# Patient Record
Sex: Female | Born: 1995 | Race: Black or African American | Hispanic: No | Marital: Single | Smoking: Current some day smoker
Health system: Southern US, Community
[De-identification: ages and names within clinical notes are randomized; demographics above are authoritative.]

## PROBLEM LIST (undated history)

## (undated) DIAGNOSIS — N83209 Unspecified ovarian cyst, unspecified side: Secondary | ICD-10-CM

## (undated) HISTORY — PX: FRACTURE SURGERY: SHX138

---
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
## 2016-01-13 ENCOUNTER — Emergency Department (HOSPITAL_COMMUNITY)
Admission: EM | Admit: 2016-01-13 | Discharge: 2016-01-14 | Disposition: A | Discharge status: Home / Self Care | Payer: Medicaid Other | Attending: Emergency Medicine | Admitting: Emergency Medicine

## 2016-01-13 ENCOUNTER — Encounter (HOSPITAL_COMMUNITY): Payer: Self-pay | Admitting: Emergency Medicine

## 2016-01-13 DIAGNOSIS — R102 Pelvic and perineal pain: Secondary | ICD-10-CM

## 2016-01-13 DIAGNOSIS — R1030 Lower abdominal pain, unspecified: Secondary | ICD-10-CM | POA: Insufficient documentation

## 2016-01-13 DIAGNOSIS — Z79899 Other long term (current) drug therapy: Secondary | ICD-10-CM | POA: Insufficient documentation

## 2016-01-13 DIAGNOSIS — N898 Other specified noninflammatory disorders of vagina: Secondary | ICD-10-CM | POA: Insufficient documentation

## 2016-01-13 DIAGNOSIS — F17210 Nicotine dependence, cigarettes, uncomplicated: Secondary | ICD-10-CM | POA: Diagnosis not present

## 2016-01-13 DIAGNOSIS — M545 Low back pain: Secondary | ICD-10-CM | POA: Insufficient documentation

## 2016-01-13 DIAGNOSIS — R112 Nausea with vomiting, unspecified: Secondary | ICD-10-CM | POA: Diagnosis not present

## 2016-01-13 LAB — URINALYSIS, ROUTINE W REFLEX MICROSCOPIC
BILIRUBIN URINE: NEGATIVE
Glucose, UA: NEGATIVE mg/dL
Hgb urine dipstick: NEGATIVE
KETONES UR: NEGATIVE mg/dL
Leukocytes, UA: NEGATIVE
NITRITE: NEGATIVE
PROTEIN: NEGATIVE mg/dL
Specific Gravity, Urine: 1.02 (ref 1.005–1.030)
pH: 6.5 (ref 5.0–8.0)

## 2016-01-13 LAB — PREGNANCY, URINE: PREG TEST UR: NEGATIVE

## 2016-01-13 MED ORDER — ONDANSETRON HCL 4 MG/2ML IJ SOLN
4.0000 mg | Freq: Once | INTRAMUSCULAR | Status: AC
  Administered 2016-01-14: 4 mg via INTRAVENOUS
  Filled 2016-01-13: qty 2

## 2016-01-13 MED ORDER — KETOROLAC TROMETHAMINE 30 MG/ML IJ SOLN
30.0000 mg | Freq: Once | INTRAMUSCULAR | Status: AC
  Administered 2016-01-14: 30 mg via INTRAVENOUS
  Filled 2016-01-13: qty 1

## 2016-01-13 MED ORDER — SODIUM CHLORIDE 0.9 % IV SOLN
1000.0000 mL | Freq: Once | INTRAVENOUS | Status: AC
  Administered 2016-01-14: 1000 mL via INTRAVENOUS

## 2016-01-13 MED ORDER — SODIUM CHLORIDE 0.9 % IV SOLN
1000.0000 mL | INTRAVENOUS | Status: DC
  Administered 2016-01-14: 1000 mL via INTRAVENOUS

## 2016-01-13 NOTE — ED Provider Notes (Signed)
CSN: [id number]     Arrival date & time 01/13/16  1825 History  By signing my name below, I, Shannon Savage, attest that this documentation has been prepared under the direction and in the presence of Shannon Booze, MD. Electronically signed, Shannon Savage, ED Scribe. 01/14/2016. 1:16 AM.  Chief Complaint  Patient presents with  . Flank Pain    The history is provided by the patient. No language interpreter was used.   HPI Comments: Shannon Savage is a 20 y.o. female who is G1P0A1, with a PMHx of ovarian cyst, who presents to the Emergency Department complaining of gradually worsening, waxing and waning, 10/10 lower abdominal pain that radiates to her lower back. Pt reports abdominal pain is exacerbated by laying in different positions. Pt also reports associated back pain that is made worse by bending at the hips. Pt also reports nausea, vomiting and white vaginal discharge. Pt states her LNMP was last month. Pt denies any constipation, diarrhea, urinary frequency. Pt denies any pain while walking. Pt also states she does not have a PCP. Past Medical History  Diagnosis Date  . Ovarian cyst    Past Surgical History  Procedure Laterality Date  . Fracture surgery     History reviewed. No pertinent family history. Social History  Substance Use Topics  . Smoking status: Current Some Day Smoker -- 0.50 packs/day    Types: Cigarettes  . Smokeless tobacco: None  . Alcohol Use: Yes     Comment: occasionally   OB History    No data available     Review of Systems  Gastrointestinal: Positive for nausea, vomiting and abdominal pain (lower). Negative for diarrhea and constipation.  Genitourinary: Positive for vaginal discharge. Negative for frequency.  Musculoskeletal: Positive for back pain (lower).  All other systems reviewed and are negative.     Allergies  Review of patient's allergies indicates no known allergies.  Home Medications   Prior to Admission medications   Medication Sig  Start Date End Date Taking? Authorizing Provider  albuterol (PROVENTIL HFA;VENTOLIN HFA) 108 (90 Base) MCG/ACT inhaler Inhale 1-2 puffs into the lungs every 6 (six) hours as needed for wheezing or shortness of breath. 10/10/15  Yes Emily West, PA-C   BP 147/90 mmHg  Pulse 83  Temp(Src) 98.5 F (36.9 C) (Oral)  Resp 18  Ht  (1.702 m)  Wt 210 lb (95.255 kg)  BMI 32.88 kg/m2  SpO2 100%  LMP 12/21/2015 Physical Exam  Constitutional: She is oriented to person, place, and time. She appears well-developed and well-nourished. No distress.  HENT:  Head: Normocephalic and atraumatic.  Eyes: EOM are normal. Pupils are equal, round, and reactive to light.  Neck: Normal range of motion. Neck supple. No JVD present.  Cardiovascular: Normal rate, regular rhythm and normal heart sounds.   No murmur heard. Pulmonary/Chest: Effort normal and breath sounds normal. She has no wheezes. She has no rales. She exhibits no tenderness.  Abdominal: Soft. She exhibits no distension and no mass. There is tenderness. There is no rebound and no guarding.  Tender diffusely with maximum tenderness in suprapubic area No rebound or guiarding Bowel sounds slightly decresed   Musculoskeletal: Normal range of motion. She exhibits tenderness. She exhibits no edema.  Bilateral CVA tenderness left greater than right  Lymphadenopathy:    She has no cervical adenopathy.  Neurological: She is alert and oriented to person, place, and time. No cranial nerve deficit. She exhibits normal muscle tone. Coordination normal.  Skin: Skin is  warm and dry. No rash noted. She is not diaphoretic.  Psychiatric: She has a normal mood and affect. Her behavior is normal. Judgment and thought content normal.  Nursing note and vitals reviewed.   ED Course  Procedures  DIAGNOSTIC STUDIES: Oxygen Saturation is 100% on RA, normal by my interpretation.  COORDINATION OF CARE: 12:51 AM-Will order pain medication and blood work. Discussed  treatment plan with pt at bedside and pt agreed to plan. Labs Review Results for orders placed or performed during the hospital encounter of 01/13/16  Pregnancy, urine  Result Value Ref Range   Preg Test, Ur NEGATIVE NEGATIVE  Urinalysis, Routine w reflex microscopic (not at [facility])  Result Value Ref Range   Color, Urine YELLOW YELLOW   APPearance CLEAR CLEAR   Specific Gravity, Urine 1.020 1.005 - 1.030   pH 6.5 5.0 - 8.0   Glucose, UA NEGATIVE NEGATIVE mg/dL   Hgb urine dipstick NEGATIVE NEGATIVE   Bilirubin Urine NEGATIVE NEGATIVE   Ketones, ur NEGATIVE NEGATIVE mg/dL   Protein, ur NEGATIVE NEGATIVE mg/dL   Nitrite NEGATIVE NEGATIVE   Leukocytes, UA NEGATIVE NEGATIVE  CBC with Differential  Result Value Ref Range   WBC 3.7 (L) 4.0 - 10.5 K/uL   RBC 4.39 3.87 - 5.11 MIL/uL   Hemoglobin 12.3 12.0 - 15.0 g/dL   HCT 16.1 09.6 - 04.5 %   MCV 82.2 78.0 - 100.0 fL   MCH 28.0 26.0 - 34.0 pg   MCHC 34.1 30.0 - 36.0 g/dL   RDW 40.9 81.1 - 91.4 %   Platelets 197 150 - 400 K/uL   Neutrophils Relative % 20 %   Neutro Abs 0.7 (L) 1.7 - 7.7 K/uL   Lymphocytes Relative 69 %   Lymphs Abs 2.6 0.7 - 4.0 K/uL   Monocytes Relative 7 %   Monocytes Absolute 0.3 0.1 - 1.0 K/uL   Eosinophils Relative 1 %   Eosinophils Absolute 0.1 0.0 - 0.7 K/uL   Basophils Relative 3 %   Basophils Absolute 0.1 0.0 - 0.1 K/uL   WBC Morphology WHITE COUNT CONFIRMED ON SMEAR    RBC Morphology TEARDROP CELLS   Lipase, blood  Result Value Ref Range   Lipase 19 11 - 51 U/L  Comprehensive metabolic panel  Result Value Ref Range   Sodium 138 135 - 145 mmol/L   Potassium 3.6 3.5 - 5.1 mmol/L   Chloride 106 101 - 111 mmol/L   CO2 27 22 - 32 mmol/L   Glucose, Bld 86 65 - 99 mg/dL   BUN 9 6 - 20 mg/dL   Creatinine, Ser 7.82 0.44 - 1.00 mg/dL   Calcium 8.7 (L) 8.9 - 10.3 mg/dL   Total Protein 7.6 6.5 - 8.1 g/dL   Albumin 4.2 3.5 - 5.0 g/dL   AST 29 15 - 41 U/L   ALT 20 14 - 54 U/L   Alkaline Phosphatase 97 38  - 126 U/L   Total Bilirubin 0.4 0.3 - 1.2 mg/dL   GFR calc non Af Amer >60 >60 mL/min   GFR calc Af Amer >60 >60 mL/min   Anion gap 5 5 - 15   Imaging Review Ct Abdomen Pelvis W Contrast  01/14/2016  CLINICAL DATA:  Bilateral low back pain and right flank pain for 1 week. EXAM: CT ABDOMEN AND PELVIS WITH CONTRAST TECHNIQUE: Multidetector CT imaging of the abdomen and pelvis was performed using the standard protocol following bolus administration of intravenous contrast. CONTRAST:  ISOVUE-300 IOPAMIDOL (ISOVUE-300)  INJECTION 61% COMPARISON:  05/07/2015 FINDINGS: Mild dependent changes in the lung bases. Circumscribed isodense mass with central scar demonstrated in the liver, probably arising from the caudate lobe. Lesion measures 4.8 cm diameter. This probably represents focal nodular hyperplasia. No change since previous study. No other focal liver lesions identified. Moderately prominent lymph nodes in the porta hepatis measuring up to about 1.3 cm diameter. These are increasing since previous study. The gallbladder, spleen, pancreas, adrenal glands, kidneys, abdominal aorta, inferior vena cava, and retroperitoneal lymph nodes are unremarkable. Stomach, small bowel, and colon are not abnormally distended. No free air or free fluid in the abdomen. Pelvis: The appendix is normal. Bladder wall is not thickened. Uterus and ovaries are not enlarged. Involuting cyst in the right ovary. Small amount of free fluid in the pelvis is likely physiologic. Stool-filled rectum. No pelvic mass or lymphadenopathy. Prominent lymph nodes in the groin regions are likely reactive. No destructive bone lesions. 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 No change since prior study. Mild prominence of lymph nodes in the porta hepatis, increasing since previous study. Could consider follow-up with elective MRI to exclude hepatic adenoma.  Involuting cyst in the right ovary. Free fluid in the pelvis is likely physiologic. Electronically Signed   By: Burman NievesWilliam  Stevens M.D.   On: 01/14/2016 02:44   I have personally reviewed and evaluated these images and lab results as part of my medical decision-making.   MDM   Final diagnoses:  Pelvic pain in female    Pelvic pain which is likely related to ovarian cyst.Consider pelvic inflammatory disease, renal colic. Workup is initiated. Patient is advised that pelvic exam is necessary to adequately diagnose her condition. Patient is refusing pelvic exam. She does understand that some diagnosis cannot be made without pelvic exam. Ultrasound is not available on, so she is sent for CT which is unremarkable. Laboratory workup is also unremarkable including normal urinalysis. She was given a dose of morphine and ketorolac with only partial relief of pain. She's given a dose of hydromorphone with good relief of pain. She is discharged with prescription for oxycodone have acetaminophen and told to use over-the-counter NSAIDs. Follow-up with gynecology.  I personally performed the services described in this documentation, which was scribed in my presence. The recorded information has been reviewed and is accurate.     Shannon Boozeavid Valree Feild, MD 01/14/16 [phone]

## 2016-01-13 NOTE — ED Notes (Signed)
Pt states she has been having lower abd pain moving around into right flank for about a week.  Denies n/v/d.

## 2016-01-14 ENCOUNTER — Emergency Department (HOSPITAL_COMMUNITY): Payer: Medicaid Other

## 2016-01-14 LAB — CBC WITH DIFFERENTIAL/PLATELET
BASOS ABS: 0.1 10*3/uL (ref 0.0–0.1)
Basophils Relative: 3 %
EOS ABS: 0.1 10*3/uL (ref 0.0–0.7)
EOS PCT: 1 %
HCT: 36.1 % (ref 36.0–46.0)
Hemoglobin: 12.3 g/dL (ref 12.0–15.0)
LYMPHS ABS: 2.6 10*3/uL (ref 0.7–4.0)
LYMPHS PCT: 69 %
MCH: 28 pg (ref 26.0–34.0)
MCHC: 34.1 g/dL (ref 30.0–36.0)
MCV: 82.2 fL (ref 78.0–100.0)
Monocytes Absolute: 0.3 10*3/uL (ref 0.1–1.0)
Monocytes Relative: 7 %
NEUTROS PCT: 20 %
Neutro Abs: 0.7 10*3/uL — ABNORMAL LOW (ref 1.7–7.7)
Platelets: 197 10*3/uL (ref 150–400)
RBC: 4.39 MIL/uL (ref 3.87–5.11)
RDW: 12.5 % (ref 11.5–15.5)
WBC: 3.7 10*3/uL — AB (ref 4.0–10.5)

## 2016-01-14 LAB — COMPREHENSIVE METABOLIC PANEL
ALBUMIN: 4.2 g/dL (ref 3.5–5.0)
ALK PHOS: 97 U/L (ref 38–126)
ALT: 20 U/L (ref 14–54)
ANION GAP: 5 (ref 5–15)
AST: 29 U/L (ref 15–41)
BUN: 9 mg/dL (ref 6–20)
CALCIUM: 8.7 mg/dL — AB (ref 8.9–10.3)
CO2: 27 mmol/L (ref 22–32)
CREATININE: 0.68 mg/dL (ref 0.44–1.00)
Chloride: 106 mmol/L (ref 101–111)
Glucose, Bld: 86 mg/dL (ref 65–99)
Potassium: 3.6 mmol/L (ref 3.5–5.1)
SODIUM: 138 mmol/L (ref 135–145)
Total Bilirubin: 0.4 mg/dL (ref 0.3–1.2)
Total Protein: 7.6 g/dL (ref 6.5–8.1)

## 2016-01-14 LAB — LIPASE, BLOOD: LIPASE: 19 U/L (ref 11–51)

## 2016-01-14 MED ORDER — IOPAMIDOL (ISOVUE-300) INJECTION 61%
100.0000 mL | Freq: Once | INTRAVENOUS | Status: AC | PRN
  Administered 2016-01-14: 100 mL via INTRAVENOUS

## 2016-01-14 MED ORDER — HYDROMORPHONE HCL 1 MG/ML IJ SOLN
1.0000 mg | Freq: Once | INTRAMUSCULAR | Status: AC
  Administered 2016-01-14: 1 mg via INTRAVENOUS
  Filled 2016-01-14: qty 1

## 2016-01-14 MED ORDER — MORPHINE SULFATE (PF) 4 MG/ML IV SOLN
4.0000 mg | Freq: Once | INTRAVENOUS | Status: AC
  Administered 2016-01-14: 4 mg via INTRAVENOUS
  Filled 2016-01-14: qty 1

## 2016-01-14 MED ORDER — OXYCODONE-ACETAMINOPHEN 5-325 MG PO TABS: 1.0000 | ORAL_TABLET | ORAL | Status: DC | PRN

## 2016-01-14 NOTE — Discharge Instructions (Signed)
Take ibuprofen as needed for less severe pain.  Pelvic Pain, Female Female pelvic pain can be caused by many different things and start from a variety of places. Pelvic pain refers to pain that is located in the lower half of the abdomen and between your hips. The pain may occur over a short period of time (acute) or may be reoccurring (chronic). The cause of pelvic pain may be related to disorders affecting the female reproductive organs (gynecologic), but it may also be related to the bladder, kidney stones, an intestinal complication, or muscle or skeletal problems. Getting help right away for pelvic pain is important, especially if there has been severe, sharp, or a sudden onset of unusual pain. It is also important to get help right away because some types of pelvic pain can be life threatening.  CAUSES  Below are only some of the causes of pelvic pain. The causes of pelvic pain can be in one of several categories.   Gynecologic.  Pelvic inflammatory disease.  Sexually transmitted infection.  Ovarian cyst or a twisted ovarian ligament (ovarian torsion).  Uterine lining that grows outside the uterus (endometriosis).  Fibroids, cysts, or tumors.  Ovulation.  Pregnancy.  Pregnancy that occurs outside the uterus (ectopic pregnancy).  Miscarriage.  Labor.  Abruption of the placenta or ruptured uterus.  Infection.  Uterine infection (endometritis).  Bladder infection.  Diverticulitis.  Miscarriage related to a uterine infection (septic abortion).  Bladder.  Inflammation of the bladder (cystitis).  Kidney stone(s).  Gastrointestinal.  Constipation.  Diverticulitis.  Neurologic.  Trauma.  Feeling pelvic pain because of mental or emotional causes (psychosomatic).  Cancers of the bowel or pelvis. EVALUATION  Your caregiver will want to take a careful history of your concerns. This includes recent changes in your health, a careful gynecologic history of your  periods (menses), and a sexual history. Obtaining your family history and medical history is also important. Your caregiver may suggest a pelvic exam. A pelvic exam will help identify the location and severity of the pain. It also helps in the evaluation of which organ system may be involved. In order to identify the cause of the pelvic pain and be properly treated, your caregiver may order tests. These tests may include:   A pregnancy test.  Pelvic ultrasonography.  An X-ray exam of the abdomen.  A urinalysis or evaluation of vaginal discharge.  Blood tests. HOME CARE INSTRUCTIONS   Only take over-the-counter or prescription medicines for pain, discomfort, or fever as directed by your caregiver.   Rest as directed by your caregiver.   Eat a balanced diet.   Drink enough fluids to make your urine clear or pale yellow, or as directed.   Avoid sexual intercourse if it causes pain.   Apply warm or cold compresses to the lower abdomen depending on which one helps the pain.   Avoid stressful situations.   Keep a journal of your pelvic pain. Write down when it started, where the pain is located, and if there are things that seem to be associated with the pain, such as food or your menstrual cycle.  Follow up with your caregiver as directed.  SEEK MEDICAL CARE IF:  Your medicine does not help your pain.  You have abnormal vaginal discharge. SEEK IMMEDIATE MEDICAL CARE IF:   You have heavy bleeding from the vagina.   Your pelvic pain increases.   You feel light-headed or faint.   You have chills.   You have pain with urination or  blood in your urine.   You have uncontrolled diarrhea or vomiting.   You have a fever or persistent symptoms for more than 3 days.  You have a fever and your symptoms suddenly get worse.   You are being physically or sexually abused.   This information is not intended to replace advice given to you by your health care provider.  Make sure you discuss any questions you have with your health care provider.   Document Released: 06/16/2004 Document Revised: 04/10/2015 Document Reviewed: 11/09/2011 Elsevier Interactive Patient Education 2016 [employer]vier Inc.  Ovarian Cyst An ovarian cyst is a fluid-filled sac that forms on an ovary. The ovaries are small organs that produce eggs in women. Various types of cysts can form on the ovaries. Most are not cancerous. Many do not cause problems, and they often go away on their own. Some may cause symptoms and require treatment. Common types of ovarian cysts include:  Functional cysts--These cysts may occur every month during the menstrual cycle. This is normal. The cysts usually go away with the next menstrual cycle if the woman does not get pregnant. Usually, there are no symptoms with a functional cyst.  Endometrioma cysts--These cysts form from the tissue that lines the uterus. They are also called "chocolate cysts" because they become filled with blood that turns brown. This type of cyst can cause pain in the lower abdomen during intercourse and with your menstrual period.  Cystadenoma cysts--This type develops from the cells on the outside of the ovary. These cysts can get very big and cause lower abdomen pain and pain with intercourse. This type of cyst can twist on itself, cut off its blood supply, and cause severe pain. It can also easily rupture and cause a lot of pain.  Dermoid cysts--This type of cyst is sometimes found in both ovaries. These cysts may contain different kinds of body tissue, such as skin, teeth, hair, or cartilage. They usually do not cause symptoms unless they get very big.  Theca lutein cysts--These cysts occur when too much of a certain hormone (human chorionic gonadotropin) is produced and overstimulates the ovaries to produce an egg. This is most common after procedures used to assist with the conception of a baby (in vitro fertilization). CAUSES    Fertility drugs can cause a condition in which multiple large cysts are formed on the ovaries. This is called ovarian hyperstimulation syndrome.  A condition called polycystic ovary syndrome can cause hormonal imbalances that can lead to nonfunctional ovarian cysts. SIGNS AND SYMPTOMS  Many ovarian cysts do not cause symptoms. If symptoms are present, they may include:  Pelvic pain or pressure.  Pain in the lower abdomen.  Pain during sexual intercourse.  Increasing girth (swelling) of the abdomen.  Abnormal menstrual periods.  Increasing pain with menstrual periods.  Stopping having menstrual periods without being pregnant. DIAGNOSIS  These cysts are commonly found during a routine or annual pelvic exam. Tests may be ordered to find out more about the cyst. These tests may include:  Ultrasound.  X-ray of the pelvis.  CT scan.  MRI.  Blood tests. TREATMENT  Many ovarian cysts go away on their own without treatment. Your health care provider may want to check your cyst regularly for 2-3 months to see if it changes. For women in menopause, it is particularly important to monitor a cyst closely because of the higher rate of ovarian cancer in menopausal women. When treatment is needed, it may include any of the following:  A procedure to drain the cyst (aspiration). This may be done using a long needle and ultrasound. It can also be done through a laparoscopic procedure. This involves using a thin, lighted tube with a tiny camera on the end (laparoscope) inserted through a small incision.  Surgery to remove the whole cyst. This may be done using laparoscopic surgery or an open surgery involving a larger incision in the lower abdomen.  Hormone treatment or birth control pills. These methods are sometimes used to help dissolve a cyst. HOME CARE INSTRUCTIONS   Only take over-the-counter or prescription medicines as directed by your health care provider.  Follow up with your  health care provider as directed.  Get regular pelvic exams and Pap tests. SEEK MEDICAL CARE IF:   Your periods are late, irregular, or painful, or they stop.  Your pelvic pain or abdominal pain does not go away.  Your abdomen becomes larger or swollen.  You have pressure on your bladder or trouble emptying your bladder completely.  You have pain during sexual intercourse.  You have feelings of fullness, pressure, or discomfort in your stomach.  You lose weight for no apparent reason.  You feel generally ill.  You become constipated.  You lose your appetite.  You develop acne.  You have an increase in body and facial hair.  You are gaining weight, without changing your exercise and eating habits.  You think you are pregnant. SEEK IMMEDIATE MEDICAL CARE IF:   You have increasing abdominal pain.  You feel sick to your stomach (nauseous), and you throw up (vomit).  You develop a fever that comes on suddenly.  You have abdominal pain during a bowel movement.  Your menstrual periods become heavier than usual. MAKE SURE YOU:  Understand these instructions.  Will watch your condition.  Will get help right away if you are not doing well or get worse.   This information is not intended to replace advice given to you by your health care provider. Make sure you discuss any questions you have with your health care provider.   Document Released: 07/20/2005 Document Revised: 07/25/2013 Document Reviewed: 03/27/2013 Elsevier Interactive Patient Education 2016 Elsevier Inc.  Acetaminophen; Oxycodone tablets What is this medicine? ACETAMINOPHEN; OXYCODONE (a set a MEE noe fen; ox i KOE done) is a pain reliever. It is used to treat moderate to severe pain. This medicine may be used for other purposes; ask your health care provider or pharmacist if you have questions. What should I tell my health care provider before I take this medicine? They need to know if you have any  of these conditions: -brain tumor -Crohn's disease, inflammatory bowel disease, or ulcerative colitis -drug abuse or addiction -head injury -heart or circulation problems -if you often drink alcohol -kidney disease or problems going to the bathroom -liver disease -lung disease, asthma, or breathing problems -an unusual or allergic reaction to acetaminophen, oxycodone, other opioid analgesics, other medicines, foods, dyes, or preservatives -pregnant or trying to get pregnant -breast-feeding How should I use this medicine? Take this medicine by mouth with a full glass of water. Follow the directions on the prescription label. You can take it with or without food. If it upsets your stomach, take it with food. Take your medicine at regular intervals. Do not take it more often than directed. Talk to your pediatrician regarding the use of this medicine in children. Special care may be needed. Patients over 19 years old may have a stronger reaction and need a  smaller dose. Overdosage: If you think you have taken too much of this medicine contact a poison control center or emergency room at once. NOTE: This medicine is only for you. Do not share this medicine with others. What if I miss a dose? If you miss a dose, take it as soon as you can. If it is almost time for your next dose, take only that dose. Do not take double or extra doses. What may interact with this medicine? -alcohol -antihistamines -barbiturates like amobarbital, butalbital, butabarbital, methohexital, pentobarbital, phenobarbital, thiopental, and secobarbital -benztropine -drugs for bladder problems like solifenacin, trospium, oxybutynin, tolterodine, hyoscyamine, and methscopolamine -drugs for breathing problems like ipratropium and tiotropium -drugs for certain stomach or intestine problems like propantheline, homatropine methylbromide, glycopyrrolate, atropine, belladonna, and dicyclomine -general anesthetics like etomidate,  ketamine, nitrous oxide, propofol, desflurane, enflurane, halothane, isoflurane, and sevoflurane -medicines for depression, anxiety, or psychotic disturbances -medicines for sleep -muscle relaxants -naltrexone -narcotic medicines (opiates) for pain -phenothiazines like perphenazine, thioridazine, chlorpromazine, mesoridazine, fluphenazine, prochlorperazine, promazine, and trifluoperazine -scopolamine -tramadol -trihexyphenidyl This list may not describe all possible interactions. Give your health care provider a list of all the medicines, herbs, non-prescription drugs, or dietary supplements you use. Also tell them if you smoke, drink alcohol, or use illegal drugs. Some items may interact with your medicine. What should I watch for while using this medicine? Tell your doctor or health care professional if your pain does not go away, if it gets worse, or if you have new or a different type of pain. You may develop tolerance to the medicine. Tolerance means that you will need a higher dose of the medication for pain relief. Tolerance is normal and is expected if you take this medicine for a long time. Do not suddenly stop taking your medicine because you may develop a severe reaction. Your body becomes used to the medicine. This does NOT mean you are addicted. Addiction is a behavior related to getting and using a drug for a non-medical reason. If you have pain, you have a medical reason to take pain medicine. Your doctor will tell you how much medicine to take. If your doctor wants you to stop the medicine, the dose will be slowly lowered over time to avoid any side effects. You may get drowsy or dizzy. Do not drive, use machinery, or do anything that needs mental alertness until you know how this medicine affects you. Do not stand or sit up quickly, especially if you are an older patient. This reduces the risk of dizzy or fainting spells. Alcohol may interfere with the effect of this medicine. Avoid  alcoholic drinks. There are different types of narcotic medicines (opiates) for pain. If you take more than one type at the same time, you may have more side effects. Give your health care provider a list of all medicines you use. Your doctor will tell you how much medicine to take. Do not take more medicine than directed. Call emergency for help if you have problems breathing. The medicine will cause constipation. Try to have a bowel movement at least every 2 to 3 days. If you do not have a bowel movement for 3 days, call your doctor or health care professional. Do not take Tylenol (acetaminophen) or medicines that have acetaminophen with this medicine. Too much acetaminophen can be very dangerous. Many nonprescription medicines contain acetaminophen. Always read the labels carefully to avoid taking more acetaminophen. What side effects may I notice from receiving this medicine? Side effects that you  should report to your doctor or health care professional as soon as possible: -allergic reactions like skin rash, itching or hives, swelling of the face, lips, or tongue -breathing difficulties, wheezing -confusion -light headedness or fainting spells -severe stomach pain -unusually weak or tired -yellowing of the skin or the whites of the eyes Side effects that usually do not require medical attention (report to your doctor or health care professional if they continue or are bothersome): -dizziness -drowsiness -nausea -vomiting This list may not describe all possible side effects. Call your doctor for medical advice about side effects. You may report side effects to FDA at 1-800-FDA-1088. Where should I keep my medicine? Keep out of the reach of children. This medicine can be abused. Keep your medicine in a safe place to protect it from theft. Do not share this medicine with anyone. Selling or giving away this medicine is dangerous and against the law. This medicine may cause accidental overdose  and death if it taken by other adults, children, or pets. Mix any unused medicine with a substance like cat litter or coffee grounds. Then throw the medicine away in a sealed container like a sealed bag or a coffee can with a lid. Do not use the medicine after the expiration date. Store at room temperature between 20 and 25 degrees C (68 and 77 degrees F). NOTE: This sheet is a summary. It may not cover all possible information. If you have questions about this medicine, talk to your doctor, pharmacist, or health care provider.    2016, Elsevier/Gold Standard. (2014-06-20 15:18:46)

## 2016-01-14 NOTE — ED Notes (Signed)
Pt and significant other remain lying on stretcher, instructed pt to leave arm straight so the IV fluids could flow freely.

## 2016-01-14 NOTE — ED Notes (Signed)
Pt states, she "does not want a pelvic performed". MD made aware.

## 2016-01-15 LAB — HIV ANTIBODY (ROUTINE TESTING W REFLEX): HIV Screen 4th Generation wRfx: NONREACTIVE

## 2016-01-15 LAB — RPR: RPR Ser Ql: NONREACTIVE

## 2016-01-24 ENCOUNTER — Encounter (HOSPITAL_COMMUNITY): Payer: Self-pay | Admitting: Emergency Medicine

## 2016-01-24 ENCOUNTER — Emergency Department (HOSPITAL_COMMUNITY)
Admission: EM | Admit: 2016-01-24 | Discharge: 2016-01-24 | Discharge status: Against Medical Advice | Payer: Medicaid Other | Attending: Emergency Medicine | Admitting: Emergency Medicine

## 2016-01-24 DIAGNOSIS — R079 Chest pain, unspecified: Secondary | ICD-10-CM | POA: Insufficient documentation

## 2016-01-24 DIAGNOSIS — M545 Low back pain: Secondary | ICD-10-CM | POA: Insufficient documentation

## 2016-01-24 DIAGNOSIS — R0981 Nasal congestion: Secondary | ICD-10-CM | POA: Diagnosis not present

## 2016-01-24 DIAGNOSIS — R05 Cough: Secondary | ICD-10-CM | POA: Insufficient documentation

## 2016-01-24 DIAGNOSIS — Z79899 Other long term (current) drug therapy: Secondary | ICD-10-CM | POA: Insufficient documentation

## 2016-01-24 DIAGNOSIS — F17210 Nicotine dependence, cigarettes, uncomplicated: Secondary | ICD-10-CM | POA: Insufficient documentation

## 2016-01-24 DIAGNOSIS — R110 Nausea: Secondary | ICD-10-CM | POA: Diagnosis not present

## 2016-01-24 DIAGNOSIS — R102 Pelvic and perineal pain: Secondary | ICD-10-CM | POA: Diagnosis not present

## 2016-01-24 DIAGNOSIS — R1031 Right lower quadrant pain: Secondary | ICD-10-CM | POA: Diagnosis present

## 2016-01-24 LAB — COMPREHENSIVE METABOLIC PANEL
ALK PHOS: 97 U/L (ref 38–126)
ALT: 39 U/L (ref 14–54)
AST: 39 U/L (ref 15–41)
Albumin: 4.1 g/dL (ref 3.5–5.0)
Anion gap: 6 (ref 5–15)
BUN: 10 mg/dL (ref 6–20)
CALCIUM: 8.6 mg/dL — AB (ref 8.9–10.3)
CHLORIDE: 106 mmol/L (ref 101–111)
CO2: 26 mmol/L (ref 22–32)
CREATININE: 0.61 mg/dL (ref 0.44–1.00)
GFR calc non Af Amer: >60 mL/min (ref >60)
Glucose, Bld: 84 mg/dL (ref 65–99)
Potassium: 3.4 mmol/L — ABNORMAL LOW (ref 3.5–5.1)
SODIUM: 138 mmol/L (ref 135–145)
Total Bilirubin: 0.7 mg/dL (ref 0.3–1.2)
Total Protein: 7.5 g/dL (ref 6.5–8.1)

## 2016-01-24 LAB — CBC WITH DIFFERENTIAL/PLATELET
BASOS ABS: 0.1 10*3/uL (ref 0.0–0.1)
Basophils Relative: 2 %
Eosinophils Absolute: 0.1 10*3/uL (ref 0.0–0.7)
Eosinophils Relative: 2 %
HCT: 37.3 % (ref 36.0–46.0)
HEMOGLOBIN: 12.7 g/dL (ref 12.0–15.0)
LYMPHS ABS: 3.1 10*3/uL (ref 0.7–4.0)
LYMPHS PCT: 63 %
MCH: 27.7 pg (ref 26.0–34.0)
MCHC: 34 g/dL (ref 30.0–36.0)
MCV: 81.4 fL (ref 78.0–100.0)
Monocytes Absolute: 0.7 10*3/uL (ref 0.1–1.0)
Monocytes Relative: 13 %
NEUTROS PCT: 20 %
Neutro Abs: 1 10*3/uL — ABNORMAL LOW (ref 1.7–7.7)
PLATELETS: 226 10*3/uL (ref 150–400)
RBC: 4.58 MIL/uL (ref 3.87–5.11)
RDW: 12.6 % (ref 11.5–15.5)
WBC: 5 10*3/uL (ref 4.0–10.5)

## 2016-01-24 LAB — URINALYSIS, ROUTINE W REFLEX MICROSCOPIC
GLUCOSE, UA: NEGATIVE mg/dL
Ketones, ur: 15 mg/dL — AB
LEUKOCYTES UA: NEGATIVE
Nitrite: NEGATIVE
PROTEIN: 100 mg/dL — AB
pH: 6 (ref 5.0–8.0)

## 2016-01-24 LAB — URINE MICROSCOPIC-ADD ON

## 2016-01-24 LAB — I-STAT BETA HCG BLOOD, ED (MC, WL, AP ONLY): I-stat hCG, quantitative: <5 m[IU]/mL (ref <5)

## 2016-01-24 LAB — LIPASE, BLOOD: Lipase: 18 U/L (ref 11–51)

## 2016-01-24 MED ORDER — FENTANYL CITRATE (PF) 100 MCG/2ML IJ SOLN: 50.0000 ug | Freq: Once | INTRAMUSCULAR | Status: DC

## 2016-01-24 MED ORDER — SODIUM CHLORIDE 0.9 % IV SOLN: INTRAVENOUS | Status: DC

## 2016-01-24 MED ORDER — FENTANYL CITRATE (PF) 100 MCG/2ML IJ SOLN
50.0000 ug | Freq: Once | INTRAMUSCULAR | Status: AC
  Administered 2016-01-24: 50 ug via INTRAVENOUS
  Filled 2016-01-24: qty 2

## 2016-01-24 MED ORDER — SODIUM CHLORIDE 0.9 % IV BOLUS (SEPSIS)
1000.0000 mL | Freq: Once | INTRAVENOUS | Status: AC
  Administered 2016-01-24: 1000 mL via INTRAVENOUS

## 2016-01-24 MED ORDER — ONDANSETRON HCL 4 MG/2ML IJ SOLN
4.0000 mg | Freq: Once | INTRAMUSCULAR | Status: AC
  Administered 2016-01-24: 4 mg via INTRAVENOUS
  Filled 2016-01-24: qty 2

## 2016-01-24 NOTE — ED Notes (Signed)
C/o abdominal pain, rating pain 10/10.  Says whole body hurts.

## 2016-01-24 NOTE — ED Provider Notes (Signed)
CSN: [id number]     Arrival date & time 01/24/16  1759 History  By signing my name below, I, Ronney LionSuzanne Le, attest that this documentation has been prepared under the direction and in the presence of Vanetta MuldersScott Reanne Nellums, MD. Electronically Signed: Ronney LionSuzanne Le, ED Scribe. 01/24/2016. 10:46 PM.  Chief Complaint  Patient presents with  . Abdominal Pain   Patient is a 20 y.o. female presenting with abdominal pain. The history is provided by the patient. No language interpreter was used.  Abdominal Pain Pain location:  RLQ and LLQ Pain quality: burning, sharp and shooting   Pain radiates to:  Back Pain severity:  Severe Onset quality:  Gradual Duration:  11 days Timing:  Constant Progression:  Worsening Chronicity:  New Relieved by: oxycodone with acetaminophen. Worsened by:  Nothing tried Ineffective treatments:  None tried Associated symptoms: chest pain (left-sided), cough and nausea   Associated symptoms: no chills, no diarrhea, no dysuria, no fever, no shortness of breath, no sore throat and no vomiting    HPI Comments: Shannon Savage is a 20 y.o. female with a history of ovarian cyst, who presents to the Emergency Department complaining of constant, 10/10, burning, sharp, shooting lower abdominal pain radiating to her lower back, right side greater than left, that began 11 days ago and acutely worsened about 8 days ago. She also complains of associated generalized myalgias, left-sided chest pain that began 2 days ago, nausea, nasal congestion, and cough. Per chart review, patient was seen by Dr. Preston FleetingGlick on 01/14/16 for the same lower abdominal pain radiating to her lower back. However, she refused a pelvic exam at that time, per medical records. Patient today also refuses a pelvic exam, stating, "I'm on my period, and I wouldn't feel right. That would be nasty." Records show that lab results and a CT abdomen at that visit was unremarkable. She was discharged with oxycodone with acetaminophen and  instructed to use NSAIDs, and follow-up with gynecology. She states she had taken the medications with moderate but transient relief. Patient denies diarrhea, fevers, chills, visual changes, rhinorrhea, sore throat, SOB, vomiting, diarrhea, dysuria, leg swelling, bleeding easily, rash, or headache.   Past Medical History  Diagnosis Date  . Ovarian cyst    Past Surgical History  Procedure Laterality Date  . Fracture surgery     History reviewed. No pertinent family history. Social History  Substance Use Topics  . Smoking status: Current Some Day Smoker -- 0.50 packs/day    Types: Cigarettes  . Smokeless tobacco: None  . Alcohol Use: Yes     Comment: occasionally   OB History    No data available     Review of Systems  Constitutional: Negative for fever and chills.  HENT: Positive for congestion. Negative for rhinorrhea and sore throat.   Eyes: Negative for visual disturbance.  Respiratory: Positive for cough. Negative for shortness of breath.   Cardiovascular: Positive for chest pain (left-sided). Negative for leg swelling.  Gastrointestinal: Positive for nausea and abdominal pain. Negative for vomiting and diarrhea.  Genitourinary: Negative for dysuria.  Musculoskeletal: Positive for myalgias (generalized) and back pain.  Skin: Negative for rash.  Neurological: Negative for headaches.  Hematological: Does not bruise/bleed easily.     Allergies  Review of patient's allergies indicates no known allergies.  Home Medications   Prior to Admission medications   Medication Sig Start Date End Date Taking? Authorizing Provider  albuterol (PROVENTIL HFA;VENTOLIN HFA) 108 (90 Base) MCG/ACT inhaler Inhale 1-2 puffs into the lungs every  6 (six) hours as needed for wheezing or shortness of breath. 10/10/15  Yes Trixie Dredge, PA-C  oxyCODONE-acetaminophen (PERCOCET) 5-325 MG tablet Take 1 tablet by mouth every 4 (four) hours as needed for moderate pain. Patient not taking: Reported on  01/24/2016 01/14/16   [name], MD   BP 119/78 mmHg  Pulse 71  Temp(Src) 98.7 F (37.1 C)  Resp 18  SpO2 100%  LMP 01/23/2016 Physical Exam  Constitutional: She is oriented to person, place, and time. She appears well-developed and well-nourished. No distress.  HENT:  Head: Normocephalic and atraumatic.  Mouth/Throat: Oropharynx is clear and moist.  Mucous membranes are moist.   Eyes: Conjunctivae and EOM are normal. Pupils are equal, round, and reactive to light. No scleral icterus.  Pupils are normal. Scleras are clear. Eyes track normal.    Neck: Neck supple. No tracheal deviation present.  Cardiovascular: Normal rate, regular rhythm and normal heart sounds.   No murmur heard. Pulmonary/Chest: Effort normal and breath sounds normal. No respiratory distress. She has no wheezes. She has no rales.  Lungs are clear to auscultation bilaterally. Room air sats at 100%.  Abdominal: Soft. Bowel sounds are normal. There is tenderness. There is no guarding.  Bilateral lower abdominal tenderness, no guarding.  Musculoskeletal: Normal range of motion. She exhibits no edema.  No ankle swelling.  Neurological: She is alert and oriented to person, place, and time.  Skin: Skin is warm and dry.  Psychiatric: She has a normal mood and affect. Her behavior is normal.  Nursing note and vitals reviewed.   ED Course  Procedures (including critical care time)  DIAGNOSTIC STUDIES: Oxygen Saturation is 100% on RA, normal by my interpretation.    COORDINATION OF CARE: 8:55 PM - Discussed treatment plan with pt at bedside which includes IV fluids, nausea medications. Strongly encouraged pt to consider pelvic exam to r/o PID. Pt verbalized understanding and agreed to plan.   Labs Review Labs Reviewed  CBC WITH DIFFERENTIAL/PLATELET - Abnormal; Notable for the following:    Neutro Abs 1.0 (*)    All other components within normal limits  URINALYSIS, ROUTINE W REFLEX MICROSCOPIC (NOT AT [facility]) -  Abnormal; Notable for the following:    Color, Urine AMBER (*)    Specific Gravity, Urine >1.030 (*)    Hgb urine dipstick LARGE (*)    Bilirubin Urine SMALL (*)    Ketones, ur 15 (*)    Protein, ur 100 (*)    All other components within normal limits  COMPREHENSIVE METABOLIC PANEL - Abnormal; Notable for the following:    Potassium 3.4 (*)    Calcium 8.6 (*)    All other components within normal limits  URINE MICROSCOPIC-ADD ON - Abnormal; Notable for the following:    Squamous Epithelial / LPF 6-30 (*)    Bacteria, UA RARE (*)    All other components within normal limits  WET PREP, GENITAL  LIPASE, BLOOD  HIV ANTIBODY (ROUTINE TESTING)  I-STAT BETA HCG BLOOD, ED ([facility], [facility], [facility] ONLY)  GC/CHLAMYDIA PROBE AMP ([place]) NOT AT [facility]    Imaging Review No results found. I have personally reviewed and evaluated these images and lab results as part of my medical decision-making.   EKG Interpretation None      MDM   Final diagnoses:  Pelvic pain in female   Patient left AMA without notice. Patient had finally agreed to have the pelvic exam done. We were prepping for that fixing the new patient left  without telling anybody. Patient now with persistent pelvic pain since June 12. Concern was for PID. Patient's pregnancy test is negative labs here today without any significant abnormalities. No leukocytosis. Urinalysis negative for urinary tract infection.  I personally performed the services described in this documentation, which was scribed in my presence. The recorded information has been reviewed and is accurate.       Vanetta MuldersScott Aboubacar Matsuo, MD 01/24/16 2317

## 2016-01-24 NOTE — ED Notes (Signed)
Pt came out to nursing desk stating " i am not staying, yall need to fix this shit" pt upset because there was blood in her iv cath, Rn attempted to explain to pt that blood does back up sometimes and all was needed was a flush, pt refused to allow RN to flush iv, states ": i want this shit out now" , risks of leaving ama explained to pt, pt states " I am going elsewhere, take this thing out" iv removed, cath intact, pt refused to sign ama form, pt loud as she was leaving by nursing desk states " i aint staying" witnessed by [place], RCharity fundraiser

## 2016-01-26 LAB — HIV ANTIBODY (ROUTINE TESTING W REFLEX): HIV Screen 4th Generation wRfx: NONREACTIVE

## 2016-03-02 ENCOUNTER — Emergency Department (HOSPITAL_COMMUNITY)
Admission: EM | Admit: 2016-03-02 | Discharge: 2016-03-02 | Disposition: A | Discharge status: Home / Self Care | Payer: Medicaid Other | Attending: Emergency Medicine | Admitting: Emergency Medicine

## 2016-03-02 ENCOUNTER — Encounter (HOSPITAL_COMMUNITY): Payer: Self-pay | Admitting: *Deleted

## 2016-03-02 DIAGNOSIS — M25362 Other instability, left knee: Secondary | ICD-10-CM

## 2016-03-02 DIAGNOSIS — R1031 Right lower quadrant pain: Secondary | ICD-10-CM | POA: Diagnosis not present

## 2016-03-02 DIAGNOSIS — R51 Headache: Secondary | ICD-10-CM | POA: Diagnosis not present

## 2016-03-02 DIAGNOSIS — F17210 Nicotine dependence, cigarettes, uncomplicated: Secondary | ICD-10-CM | POA: Insufficient documentation

## 2016-03-02 DIAGNOSIS — R519 Headache, unspecified: Secondary | ICD-10-CM

## 2016-03-02 LAB — URINALYSIS, ROUTINE W REFLEX MICROSCOPIC
Bilirubin Urine: NEGATIVE
GLUCOSE, UA: NEGATIVE mg/dL
HGB URINE DIPSTICK: NEGATIVE
Ketones, ur: NEGATIVE mg/dL
LEUKOCYTES UA: NEGATIVE
Nitrite: NEGATIVE
PH: 5.5 (ref 5.0–8.0)
Protein, ur: NEGATIVE mg/dL
Specific Gravity, Urine: 1.02 (ref 1.005–1.030)

## 2016-03-02 LAB — POC URINE PREG, ED: Preg Test, Ur: NEGATIVE

## 2016-03-02 MED ORDER — DIPHENHYDRAMINE HCL 50 MG/ML IJ SOLN
25.0000 mg | Freq: Once | INTRAMUSCULAR | Status: AC
  Administered 2016-03-02: 25 mg via INTRAVENOUS
  Filled 2016-03-02: qty 1

## 2016-03-02 MED ORDER — NAPROXEN 500 MG PO TABS: ORAL_TABLET | ORAL | 0 refills | Status: DC

## 2016-03-02 MED ORDER — KETOROLAC TROMETHAMINE 30 MG/ML IJ SOLN
30.0000 mg | Freq: Once | INTRAMUSCULAR | Status: AC
  Administered 2016-03-02: 30 mg via INTRAVENOUS
  Filled 2016-03-02: qty 1

## 2016-03-02 MED ORDER — SODIUM CHLORIDE 0.9 % IV BOLUS (SEPSIS)
1000.0000 mL | Freq: Once | INTRAVENOUS | Status: AC
  Administered 2016-03-02: 1000 mL via INTRAVENOUS

## 2016-03-02 MED ORDER — METOCLOPRAMIDE HCL 5 MG/ML IJ SOLN
10.0000 mg | Freq: Once | INTRAMUSCULAR | Status: AC
  Administered 2016-03-02: 10 mg via INTRAVENOUS
  Filled 2016-03-02: qty 2

## 2016-03-02 NOTE — ED Provider Notes (Signed)
[facility]-EMERGENCY DEPT Provider Note   CSN: [id number] Arrival date & time: 03/02/16  0223  First Provider Contact: 03:20 AM      History   Chief Complaint Chief Complaint  Patient presents with  . Migraine    HPI Shannon Savage is a 20 y.o. female.  HPI patient reports she started getting a headache 2-3 days ago. Her headache is whole cranial and she describes it as sharp, pressure, and throbbing in her temples. She has had nausea without vomiting. She has some mild blurred vision at times. She denies numbness or tingling of her extremities.  Patient states sometimes her left knee feels like it's going to give out on her. She states when she walks her knee "feels warm". She states this has been going on for months. She denies any known injury. She denies any pain.  Patient also states she's had right lower quadrant pain for the past week. The pain is described as being constant and sharp. She denies diarrhea, dysuria, vaginal discharge, but does have some frequency. She states eating spicy food makes the pain worse, nothing makes it feel better. She has tried heat without relief. She states she's had this pain before and they told her she had an ovarian cyst.   PCP none  Past Medical History:  Diagnosis Date  . Ovarian cyst     There are no active problems to display for this patient.   Past Surgical History:  Procedure Laterality Date  . FRACTURE SURGERY      OB History    No data available       Home Medications    Prior to Admission medications   Medication Sig Start Date End Date Taking? Authorizing Provider  albuterol (PROVENTIL HFA;VENTOLIN HFA) 108 (90 Base) MCG/ACT inhaler Inhale 1-2 puffs into the lungs every 6 (six) hours as needed for wheezing or shortness of breath. 10/10/15   Trixie Dredge, PA-C  naproxen (NAPROSYN) 500 MG tablet Take 1 po BID with food prn pain 03/02/16   Devoria Albe, MD  oxyCODONE-acetaminophen (PERCOCET) 5-325 MG tablet Take 1 tablet by  mouth every 4 (four) hours as needed for moderate pain. Patient not taking: Reported on 01/24/2016 01/14/16   [name], MD    Family History No family history on file.  Social History Social History  Substance Use Topics  . Smoking status: Current Some Day Smoker    Packs/day: 0.50    Types: Cigarettes  . Smokeless tobacco: Never Used  . Alcohol use No     Comment: occasionally  Will be a senior in high school Smokes 1 pack per week   Allergies   Review of patient's allergies indicates no known allergies.   Review of Systems Review of Systems  All other systems reviewed and are negative.    Physical Exam Updated Vital Signs BP 128/75   Pulse 73   Temp 98.2 F (36.8 C) (Oral)   Resp 20   Ht  (1.727 m)   Wt 210 lb (95.3 kg)   LMP 01/23/2016   SpO2 99%   BMI 31.93 kg/m   Vital signs normal    Physical Exam  Constitutional: She is oriented to person, place, and time. She appears well-developed and well-nourished.  Non-toxic appearance. She does not appear ill. No distress.  Watching TV in a well lit room  HENT:  Head: Normocephalic and atraumatic.  Right Ear: External ear normal.  Left Ear: External ear normal.  Nose: Nose normal. No mucosal  edema or rhinorrhea.  Mouth/Throat: Oropharynx is clear and moist and mucous membranes are normal. No dental abscesses or uvula swelling.  Eyes: Conjunctivae and EOM are normal. Pupils are equal, round, and reactive to light.  Neck: Normal range of motion and full passive range of motion without pain. Neck supple.  Cardiovascular: Normal rate, regular rhythm and normal heart sounds.  Exam reveals no gallop and no friction rub.   No murmur heard. Pulmonary/Chest: Effort normal and breath sounds normal. No respiratory distress. She has no wheezes. She has no rhonchi. She has no rales. She exhibits no tenderness and no crepitus.  Abdominal: Soft. Normal appearance and bowel sounds are normal. She exhibits no distension.  There is no tenderness. There is no rebound and no guarding.    Musculoskeletal: Normal range of motion. She exhibits no edema or tenderness.  Moves all extremities well. Her knee is nontender to palpation. She has good range of motion of her left knee.  Neurological: She is alert and oriented to person, place, and time. She has normal strength. No cranial nerve deficit.  Skin: Skin is warm, dry and intact. No rash noted. No erythema. No pallor.  Psychiatric: She has a normal mood and affect. Her speech is normal and behavior is normal. Her mood appears not anxious.  Nursing note and vitals reviewed.    ED Treatments / Results  Labs (all labs ordered are listed, but only abnormal results are displayed) Results for orders placed or performed during the hospital encounter of 03/02/16  Urinalysis, Routine w reflex microscopic (not at [facility])  Result Value Ref Range   Color, Urine YELLOW YELLOW   APPearance CLEAR CLEAR   Specific Gravity, Urine 1.020 1.005 - 1.030   pH 5.5 5.0 - 8.0   Glucose, UA NEGATIVE NEGATIVE mg/dL   Hgb urine dipstick NEGATIVE NEGATIVE   Bilirubin Urine NEGATIVE NEGATIVE   Ketones, ur NEGATIVE NEGATIVE mg/dL   Protein, ur NEGATIVE NEGATIVE mg/dL   Nitrite NEGATIVE NEGATIVE   Leukocytes, UA NEGATIVE NEGATIVE  POC Urine Pregnancy, ED (do NOT order at [facility])  Result Value Ref Range   Preg Test, Ur NEGATIVE NEGATIVE   Laboratory interpretation all normal     EKG  EKG Interpretation None       Radiology No results found.  Procedures Procedures (including critical care time)  Medications Ordered in ED Medications  sodium chloride 0.9 % bolus 1,000 mL (1,000 mLs Intravenous New Bag/Given 03/02/16 0344)  metoCLOPramide (REGLAN) injection 10 mg (10 mg Intravenous Given 03/02/16 0345)  diphenhydrAMINE (BENADRYL) injection 25 mg (25 mg Intravenous Given 03/02/16 0344)  ketorolac (TORADOL) 30 MG/ML injection 30 mg (30 mg Intravenous Given 03/02/16 0347)      Initial Impression / Assessment and Plan / ED Course  I have reviewed the triage vital signs and the nursing notes.  Pertinent labs & imaging results that were available during my care of the patient were reviewed by me and considered in my medical decision making (see chart for details).  Clinical Course    Patient was given IV fluids and migraine cocktail including Toradol.  Recheck at 4 AM patient states her headache is gone, she states her right lower quadrant pain is still there but better.  Recheck at time of discharge patient sleeping in no distress. She states although her right lower quadrant pain is there it is improved. Her headache is gone. Patient will be referred to orthopedics for her knee giving out on her. [facility]/pelvic pain. She was given a prescription for naproxen to take for her headache or her right lower quadrant pain. She should be rechecked if she gets fever, has uncontrolled vomiting or worsening pain.   Review of patient's prior testing shows in October 2012 she had a abdominal/pelvis CT scan that showed a 5.5 cm left ovarian cyst, she had a abdominal/pelvis CT scan done June 13 showing any involuting cyst on the right ovary. She had a pelvic ultrasound done December 2016 that was negative.  Final Clinical Impressions(s) / ED Diagnoses   Final diagnoses:  Acute nonintractable headache, unspecified headache type  RLQ abdominal pain  Knee gives out, left    New Prescriptions New Prescriptions   NAPROXEN (NAPROSYN) 500 MG TABLET    Take 1 po BID with food prn pain    Plan discharge  Devoria Albe, MD, Concha Pyo, MD 03/02/16 [id number]

## 2016-03-02 NOTE — Discharge Instructions (Signed)
You can use ice packs on your knee. Take the naproxen if you get the headache again. It will also help with your pain in your right lower abdomen. Consider calling Dr. Mort Sawyers office to get an appointment to have him recheck your knee. He is an Investment banker, operational. If you continue to have the lower abdominal pain he can be rechecked [facility]. He should return to the emergency department if you get fever, uncontrolled vomiting, or have severe pain.

## 2016-03-02 NOTE — ED Triage Notes (Signed)
Pt c/o headache that became worse yesterday, abd pain that started a week ago, and left knee pain that started 3 days ago,

## 2016-04-07 IMAGING — CR DG CHEST 2V
2 series · 2 of 2 positions shown · non-contrast
Comparison: None.

CLINICAL DATA: Subacute onset of mid chest pain, radiating under
the left breast, with shortness of breath. Pain radiates to the
right side of the neck. Initial encounter.

EXAM:
CHEST  2 VIEW

[chest pa]
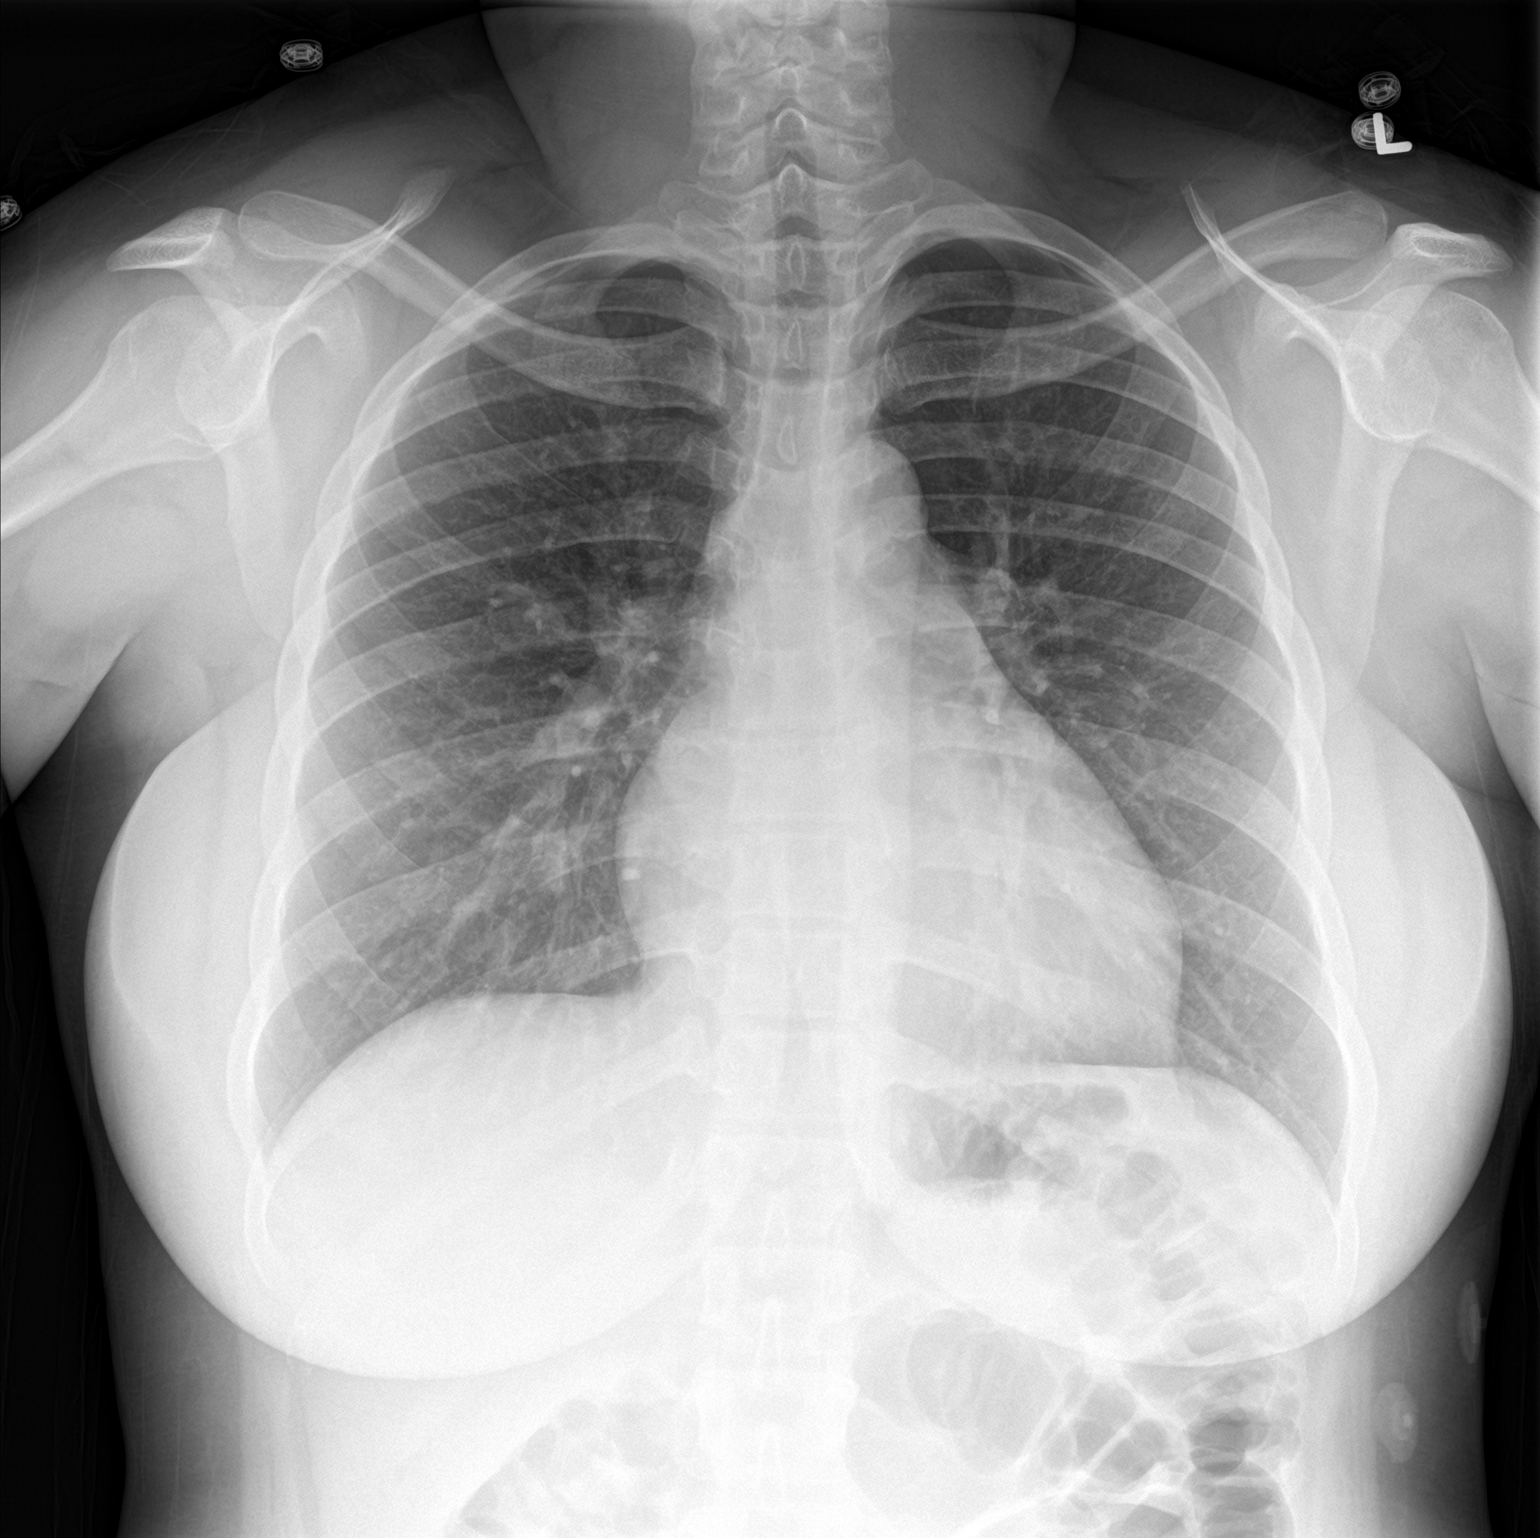

[chest lat]
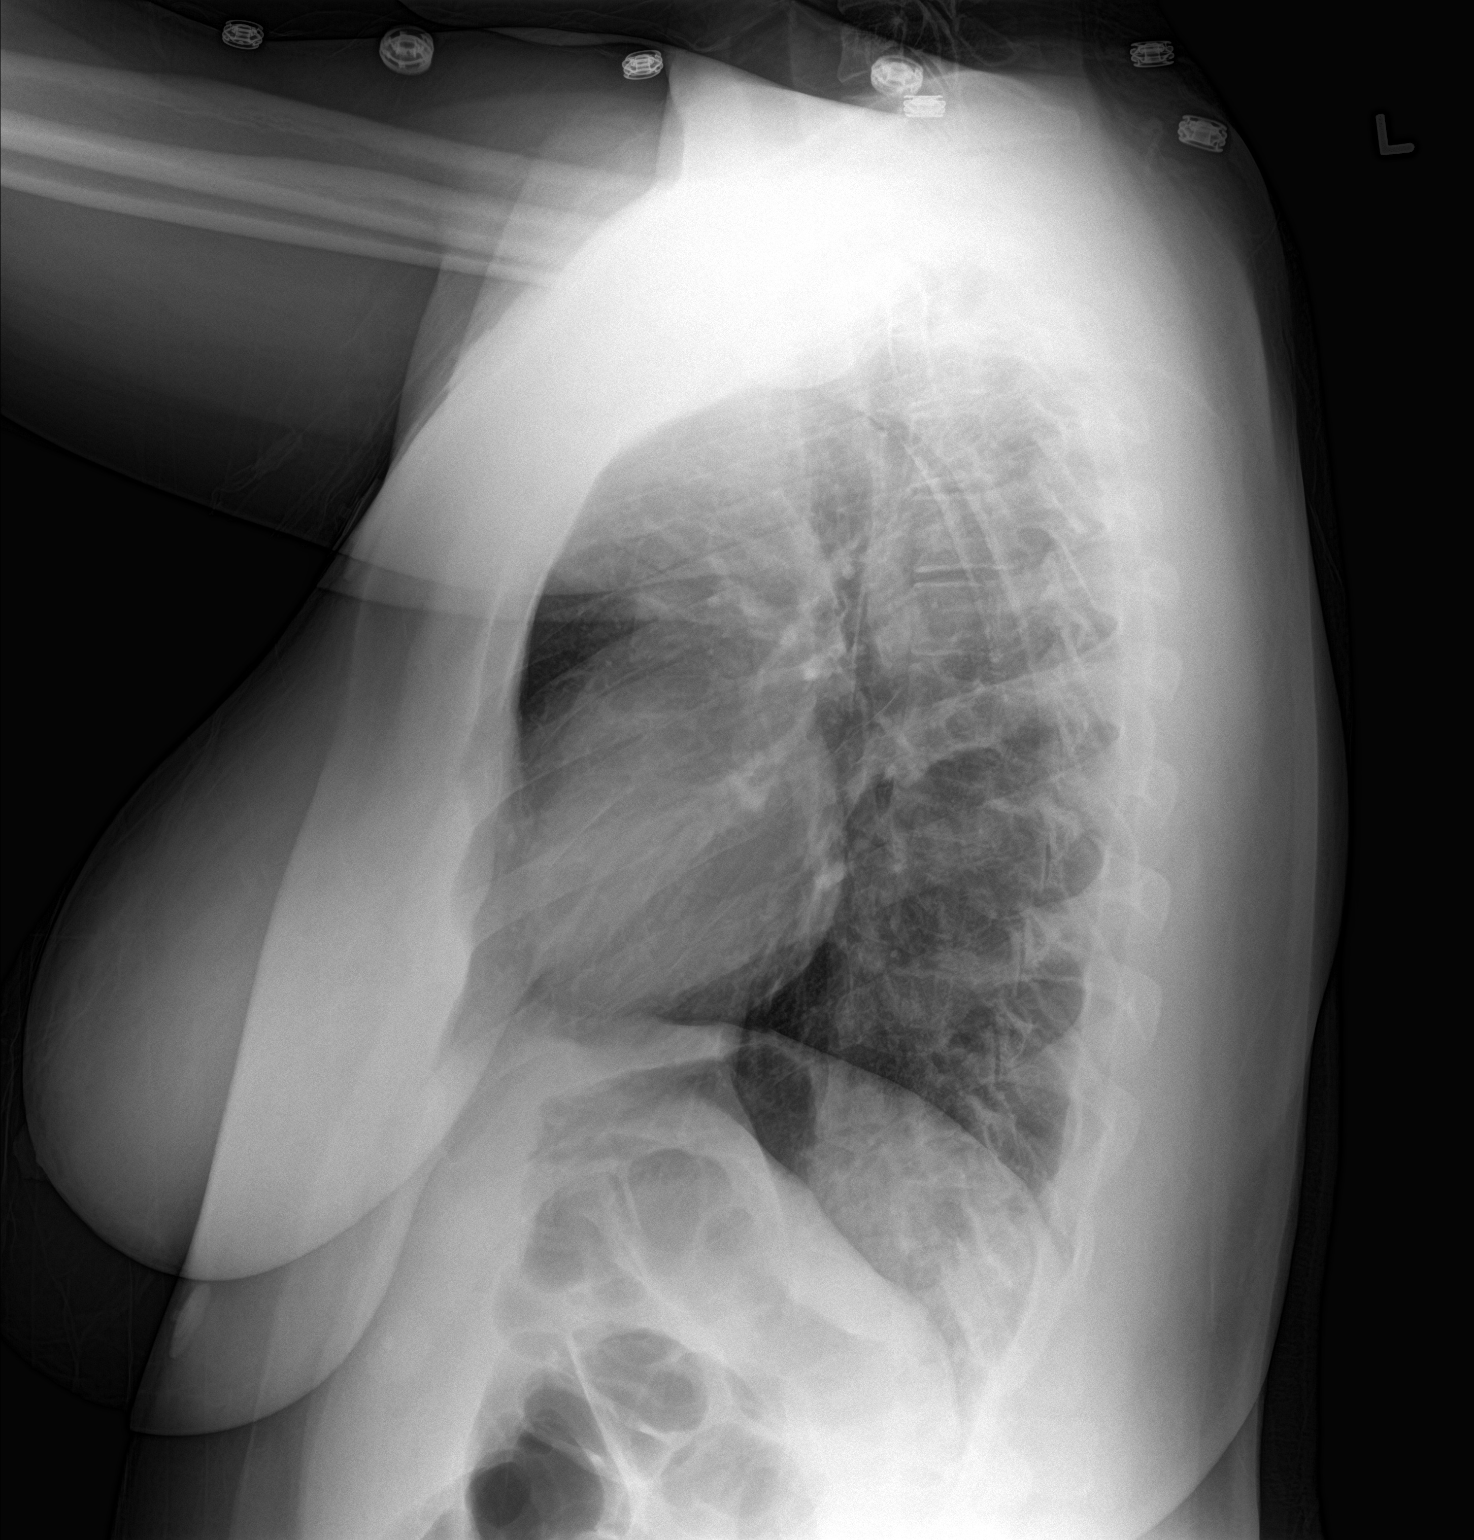

[2 of 2 positions shown; findings below may reference images not displayed]

FINDINGS: The lungs are well-aerated and clear. There is no evidence of focal
opacification, pleural effusion or pneumothorax.

The heart is normal in size; the mediastinal contour is within
normal limits. No acute osseous abnormalities are seen.
IMPRESSION: No acute cardiopulmonary process seen.

## 2016-09-15 IMAGING — CT CT ABD-PELV W/ CM
2 of 4 series · 16 of 46 positions shown, 18 images · IV contrast (iopamidol)
Comparison: 05/07/2015

CLINICAL DATA: Bilateral low back pain and right flank pain for 1
week.

EXAM:
CT ABDOMEN AND PELVIS WITH CONTRAST
TECHNIQUE: Multidetector CT imaging of the abdomen and pelvis was performed
using the standard protocol following bolus administration of
intravenous contrast.
CONTRAST:  100mL [id number]-IXX IOPAMIDOL ([id number]-IXX) INJECTION 61%

[Series 2: routine abd pel with · axial · 0.68mm/px · z∈[-610,-170]mm · 13 of 98 slices shown, 15 images]
[im 5/98  soft-tissue]
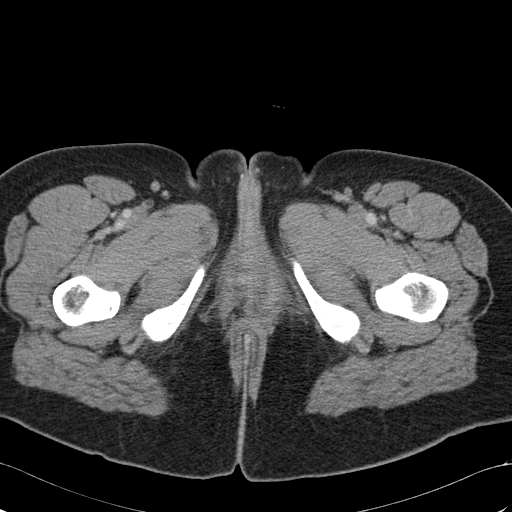
[im 5/98  bone]
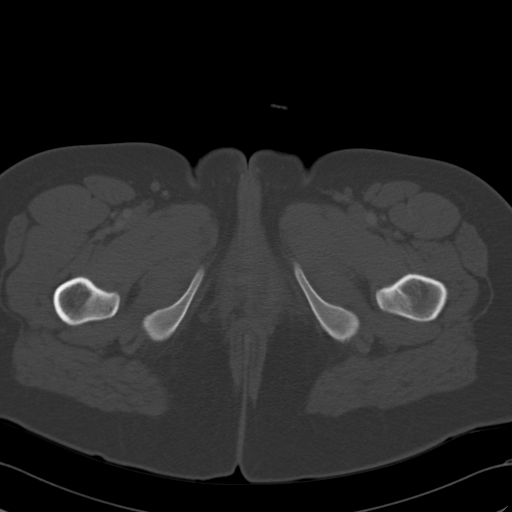
[im 14/98  soft-tissue]
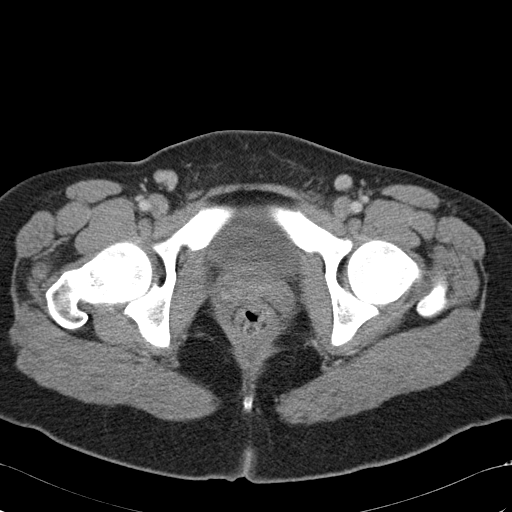
[im 23/98  soft-tissue]
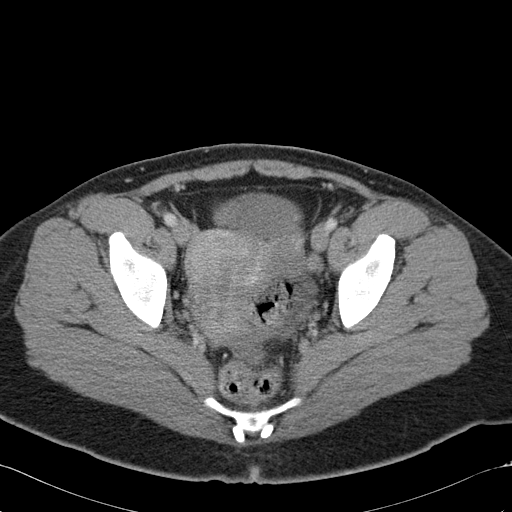
[im 27/98  soft-tissue]
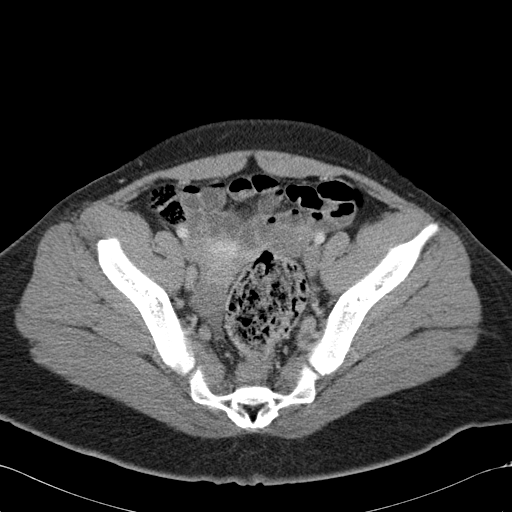
[im 36/98  soft-tissue]
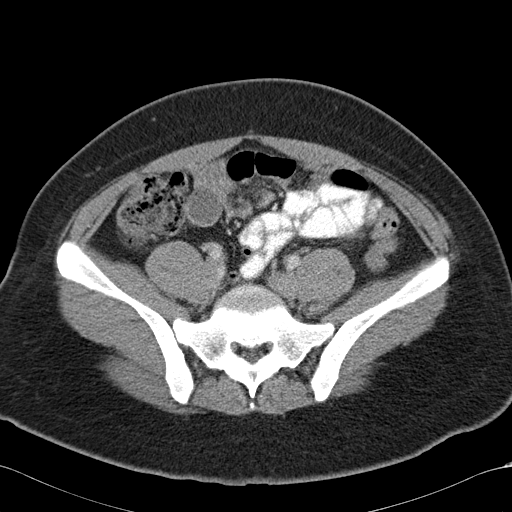
[im 40/98  soft-tissue]
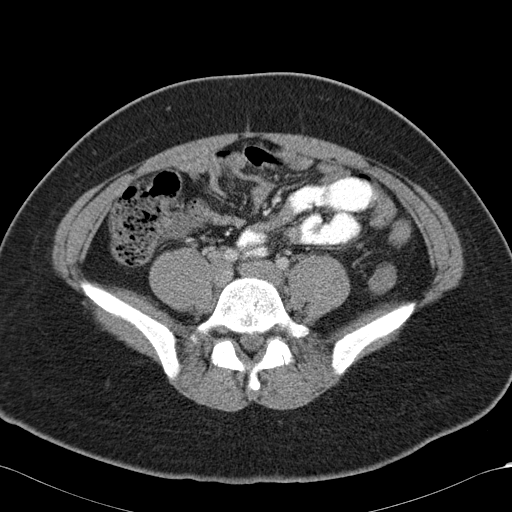
[im 49/98  soft-tissue]
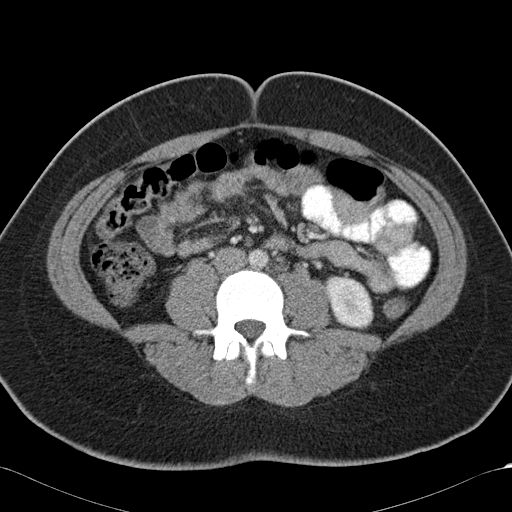
[im 58/98  soft-tissue]
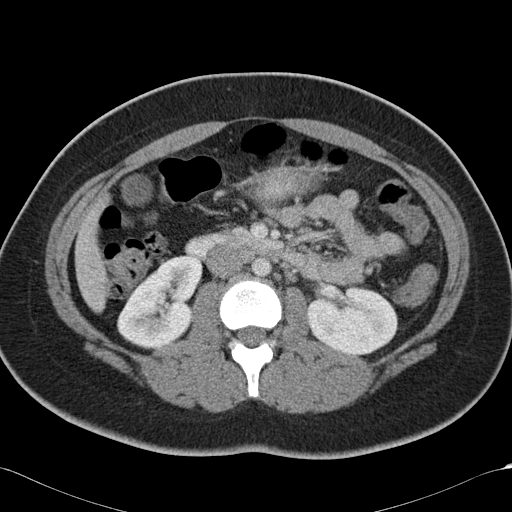
[im 62/98  soft-tissue]
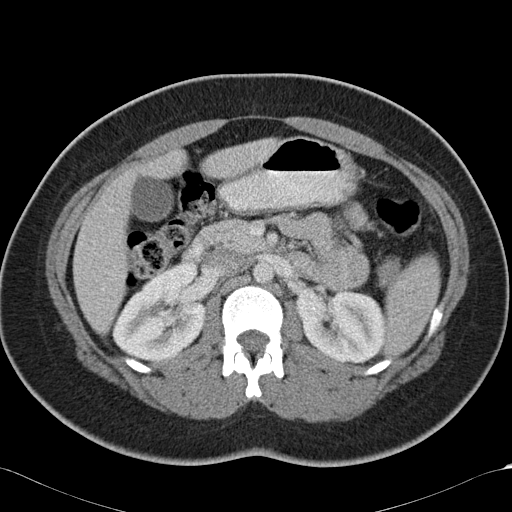
[im 62/98  bone]
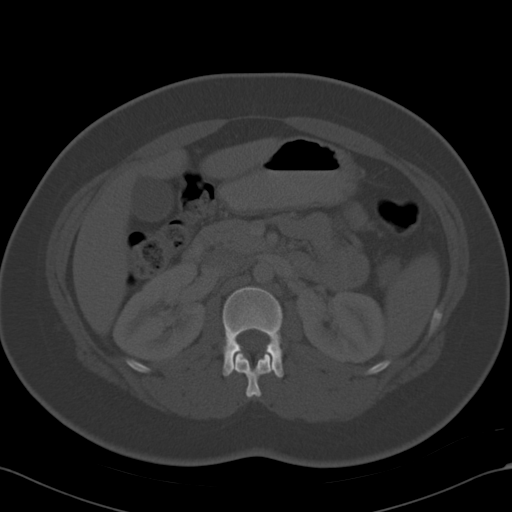
[im 71/98  soft-tissue]
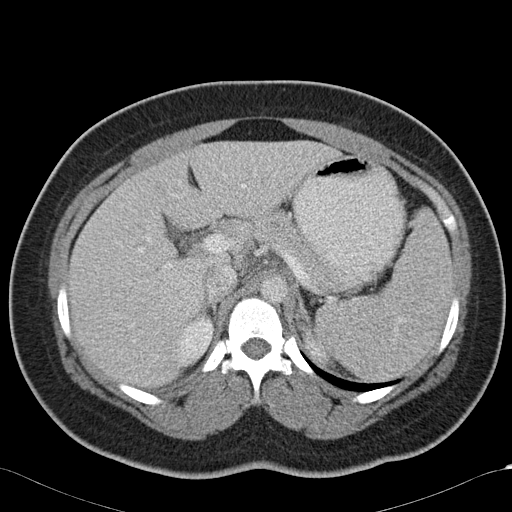
[im 75/98  soft-tissue]
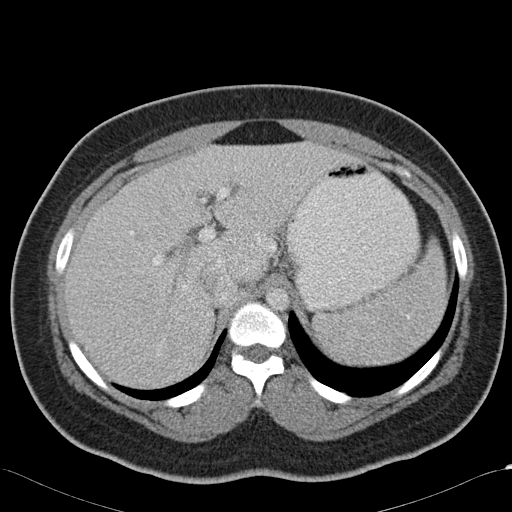
[im 84/98  soft-tissue]
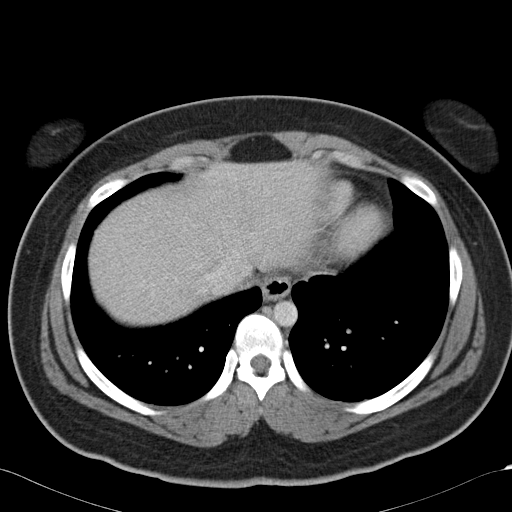
[im 93/98  soft-tissue]
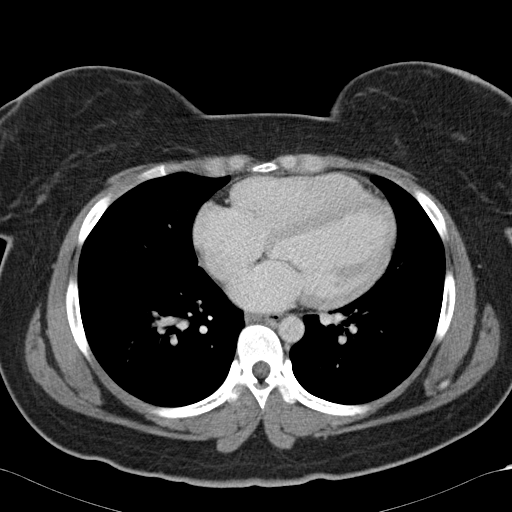

[Series 3: coronal · coronal · 0.75mm/px · 3 of 147 slices shown]
[im 49/147  soft-tissue]
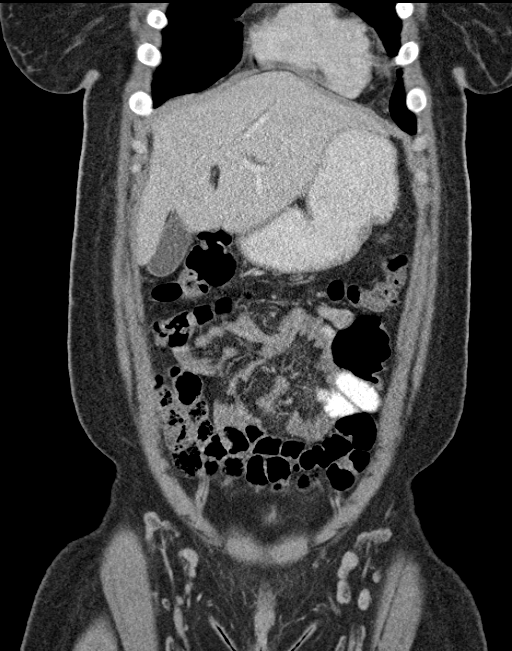
[im 65/147  soft-tissue]
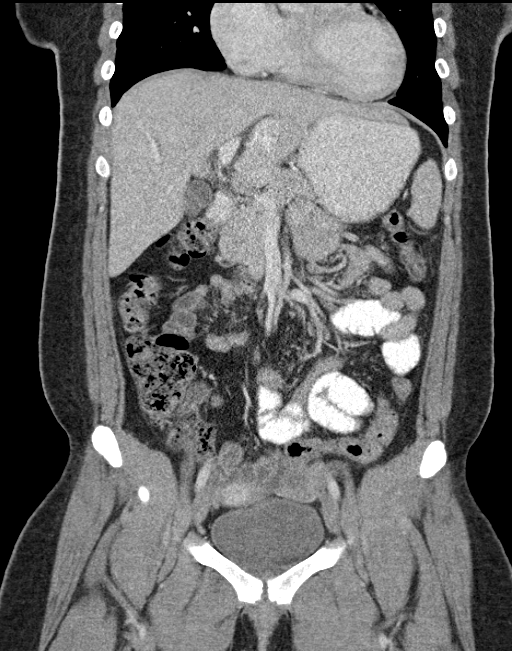
[im 82/147  soft-tissue]
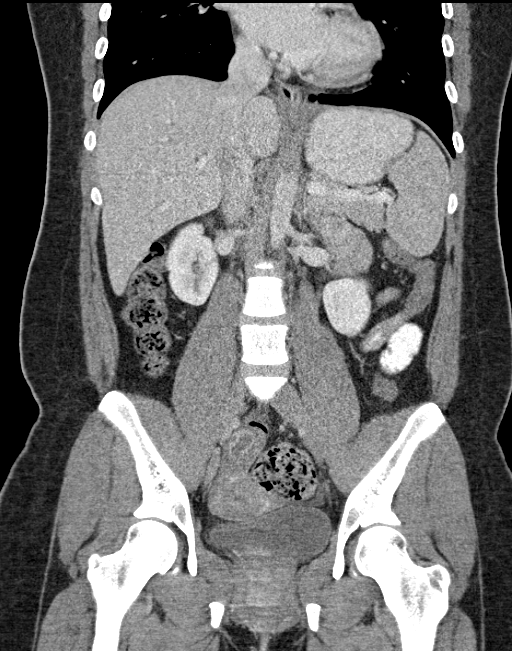

[16 of 46 positions shown; findings below may reference images not displayed]

FINDINGS: Mild dependent changes in the lung bases.

Circumscribed isodense mass with central scar demonstrated in the
liver, probably arising from the caudate lobe. Lesion measures
cm diameter. This probably represents focal nodular hyperplasia. No
change since previous study. No other focal liver lesions
identified. Moderately prominent lymph nodes in the porta hepatis
measuring up to about 1.3 cm diameter. These are increasing since
previous study. The gallbladder, spleen, pancreas, adrenal glands,
kidneys, abdominal aorta, inferior vena cava, and retroperitoneal
lymph nodes are unremarkable. Stomach, small bowel, and colon are
not abnormally distended. No free air or free fluid in the abdomen.

Pelvis: The appendix is normal. Bladder wall is not thickened.
Uterus and ovaries are not enlarged. Involuting cyst in the right
ovary. Small amount of free fluid in the pelvis is likely
physiologic. Stool-filled rectum. No pelvic mass or lymphadenopathy.
Prominent lymph nodes in the groin regions are likely reactive. No
destructive bone lesions.
IMPRESSION: Isodense mass in the caudate lobe of the liver with central scar
likely representing focal nodular hyperplasia. No change since prior
study. Mild prominence of lymph nodes in the porta hepatis,
increasing since previous study. Could consider follow-up with
elective MRI to exclude hepatic adenoma. Involuting cyst in the
right ovary. Free fluid in the pelvis is likely physiologic.

## 2018-05-26 ENCOUNTER — Telehealth: Payer: Self-pay | Admitting: *Deleted

## 2018-05-26 NOTE — Telephone Encounter (Signed)
Entered in error

## 2018-06-11 ENCOUNTER — Emergency Department (HOSPITAL_COMMUNITY)
Admission: EM | Admit: 2018-06-11 | Discharge: 2018-06-11 | Disposition: A | Discharge status: Home / Self Care | Payer: Medicaid Other | Attending: Emergency Medicine | Admitting: Emergency Medicine

## 2018-06-11 ENCOUNTER — Other Ambulatory Visit: Payer: Self-pay

## 2018-06-11 DIAGNOSIS — A599 Trichomoniasis, unspecified: Secondary | ICD-10-CM

## 2018-06-11 DIAGNOSIS — R21 Rash and other nonspecific skin eruption: Secondary | ICD-10-CM

## 2018-06-11 DIAGNOSIS — N898 Other specified noninflammatory disorders of vagina: Secondary | ICD-10-CM | POA: Diagnosis present

## 2018-06-11 DIAGNOSIS — F17210 Nicotine dependence, cigarettes, uncomplicated: Secondary | ICD-10-CM | POA: Diagnosis not present

## 2018-06-11 DIAGNOSIS — Z79899 Other long term (current) drug therapy: Secondary | ICD-10-CM | POA: Insufficient documentation

## 2018-06-11 LAB — POC URINE PREG, ED: Preg Test, Ur: NEGATIVE

## 2018-06-11 LAB — WET PREP, GENITAL
Clue Cells Wet Prep HPF POC: NONE SEEN
Sperm: NONE SEEN
Yeast Wet Prep HPF POC: NONE SEEN

## 2018-06-11 LAB — URINALYSIS, ROUTINE W REFLEX MICROSCOPIC
BILIRUBIN URINE: NEGATIVE
Glucose, UA: NEGATIVE mg/dL
Ketones, ur: 5 mg/dL — AB
NITRITE: NEGATIVE
Protein, ur: NEGATIVE mg/dL
SPECIFIC GRAVITY, URINE: 1.023 (ref 1.005–1.030)
pH: 5 (ref 5.0–8.0)

## 2018-06-11 MED ORDER — AZITHROMYCIN 250 MG PO TABS
1000.0000 mg | ORAL_TABLET | Freq: Once | ORAL | Status: AC
  Administered 2018-06-11: 1000 mg via ORAL
  Filled 2018-06-11: qty 4

## 2018-06-11 MED ORDER — LIDOCAINE HCL (PF) 1 % IJ SOLN
INTRAMUSCULAR | Status: AC
  Administered 2018-06-11: 5 mL
  Filled 2018-06-11: qty 5

## 2018-06-11 MED ORDER — METRONIDAZOLE 500 MG PO TABS
2000.0000 mg | ORAL_TABLET | Freq: Once | ORAL | Status: AC
  Administered 2018-06-11: 2000 mg via ORAL
  Filled 2018-06-11: qty 4

## 2018-06-11 MED ORDER — MUPIROCIN CALCIUM 2 % EX CREA: 1.0000 "application " | TOPICAL_CREAM | Freq: Two times a day (BID) | CUTANEOUS | 0 refills | Status: DC

## 2018-06-11 MED ORDER — ONDANSETRON 4 MG PO TBDP
4.0000 mg | ORAL_TABLET | Freq: Once | ORAL | Status: AC
  Administered 2018-06-11: 4 mg via ORAL
  Filled 2018-06-11: qty 1

## 2018-06-11 MED ORDER — CEFTRIAXONE SODIUM 250 MG IJ SOLR
250.0000 mg | Freq: Once | INTRAMUSCULAR | Status: AC
  Administered 2018-06-11: 250 mg via INTRAMUSCULAR
  Filled 2018-06-11: qty 250

## 2018-06-11 NOTE — ED Triage Notes (Signed)
Patient c/o rash in thighs and white, vaginal discharge that began 4 days ago. Denies abd pain.

## 2018-06-11 NOTE — ED Notes (Signed)
Advised patient that we need urine specimen. States unable to provide at this time.

## 2018-06-11 NOTE — Discharge Instructions (Signed)
Please read and follow all provided instructions.  Your diagnoses today include:  1. Trichomoniasis   2. Rash and nonspecific skin eruption     Tests performed today include:  Test for gonorrhea and chlamydia.   Test for HIV and syphilis.   You will be notified by telephone with any positive results.   Vital signs. See below for your results today.   Medications:  You were treated with azithromycin, metronidazole and rocephin today. These antibiotics treat you for gonorrhea and chlamydia. They do not treat for HIV or syphilis.   Home care instructions:  Read educational materials contained in this packet and follow any instructions provided.   You should tell your partners about your infection and avoid having sex for one week to allow time for the medicine to work.  Sexually transmitted disease testing also available at:   [facility], [facility]  [address] [place], phone [phone] or [phone]    Monday - Friday, call for an appointment  [facility] Department of [facility], [facility]  [address], [place], phone [phone] or [phone]   Monday - Friday, call for an appointment  Return instructions:   Please return to the Emergency Department if you experience worsening symptoms.   Please return if you have any other emergent concerns.  Additional Information:  Your vital signs today were: BP 97/82    Pulse 85    Temp 97.9 F (36.6 C) (Oral)    Resp 16    Ht 5\' 8"  (1.727 m)    Wt 99.3 kg    SpO2 98%    BMI 33.30 kg/m  If your blood pressure (BP) was elevated above 135/85 this visit, please have this repeated by your doctor within one month. --------------

## 2018-06-11 NOTE — ED Provider Notes (Signed)
[facility] EMERGENCY DEPARTMENT Provider Note   CSN: [id number] Arrival date & time: 06/11/18  2026     History   Chief Complaint Chief Complaint  Patient presents with  . Vaginal Discharge  . Rash    HPI Shannon Savage is a 22 y.o. female.  Patient presents to the emergency department with complaint of a thick white vaginal discharge for the past 4 days with associated itchy rash to her bilateral inner thighs.  Patient is sexually active and is concerned about a sexually transmitted infection.  Patient is sexually active with one female partner.  She denies any abdominal pain.  She denies any dysuria, hematuria.  No reported fevers, nausea, vomiting, or diarrhea.  Patient also volunteers that she has not had a menstrual period in approximately 1 year.  States negative home pregnancy test.  Prior to this she would have a period every 3 to 4 months.  She has been told that she has had a ovarian cyst in the past. The onset of this condition was acute. The course is constant. Aggravating factors: none. Alleviating factors: none.       Past Medical History:  Diagnosis Date  . Ovarian cyst     There are no active problems to display for this patient.   Past Surgical History:  Procedure Laterality Date  . FRACTURE SURGERY       OB History   None      Home Medications    Prior to Admission medications   Medication Sig Start Date End Date Taking? Authorizing Provider  albuterol (PROVENTIL HFA;VENTOLIN HFA) 108 (90 Base) MCG/ACT inhaler Inhale 1-2 puffs into the lungs every 6 (six) hours as needed for wheezing or shortness of breath. 10/10/15   Trixie Dredge, PA-C  naproxen (NAPROSYN) 500 MG tablet Take 1 po BID with food prn pain 03/02/16   Devoria Albe, MD  oxyCODONE-acetaminophen (PERCOCET) 5-325 MG tablet Take 1 tablet by mouth every 4 (four) hours as needed for moderate pain. Patient not taking: Reported on 01/24/2016 01/14/16   [name], MD    Family  History No family history on file.  Social History Social History   Tobacco Use  . Smoking status: Current Some Day Smoker    Packs/day: 0.50    Types: Cigarettes  . Smokeless tobacco: Never Used  Substance Use Topics  . Alcohol use: No    Comment: occasionally  . Drug use: No     Allergies   Patient has no known allergies.   Review of Systems Review of Systems  Constitutional: Negative for fever.  HENT: Negative for sore throat.   Eyes: Negative for discharge.  Gastrointestinal: Negative for abdominal pain and rectal pain.  Genitourinary: Positive for genital sores and vaginal discharge. Negative for dysuria, frequency, pelvic pain and vaginal bleeding.  Musculoskeletal: Negative for arthralgias.  Skin: Negative for rash.  Hematological: Negative for adenopathy.     Physical Exam Updated Vital Signs BP 97/82   Pulse 85   Temp 97.9 F (36.6 C) (Oral)   Resp 16   Ht 5\' 8"  (1.727 m)   Wt 99.3 kg   SpO2 98%   BMI 33.30 kg/m   Physical Exam  Constitutional: She appears well-developed and well-nourished.  HENT:  Head: Normocephalic and atraumatic.  Eyes: Conjunctivae are normal. Right eye exhibits no discharge. Left eye exhibits no discharge.  Neck: Normal range of motion. Neck supple.  Cardiovascular: Normal rate, regular rhythm and normal heart sounds.  Pulmonary/Chest: Effort  normal and breath sounds normal.  Abdominal: Soft. There is no tenderness.  Genitourinary: Uterus normal. Pelvic exam was performed with patient supine. There is lesion on the right labia. There is no rash or tenderness on the right labia. There is lesion on the left labia. There is no rash or tenderness on the left labia. Cervix exhibits no motion tenderness, no discharge and no friability. Right adnexum displays no mass, no tenderness and no fullness. Left adnexum displays no mass, no tenderness and no fullness. No erythema in the vagina. Vaginal discharge (white-green, thick) found.    Genitourinary Comments: Mild rash, bilateral labia, appears most consistent with mild folliculitis. It is itchy. No vesicles seen. No erythematous base.   Neurological: She is alert.  Skin: Skin is warm and dry.  Psychiatric: She has a normal mood and affect.  Nursing note and vitals reviewed.    ED Treatments / Results  Labs (all labs ordered are listed, but only abnormal results are displayed) Labs Reviewed  WET PREP, GENITAL - Abnormal; Notable for the following components:      Result Value   Trich, Wet Prep PRESENT (*)    WBC, Wet Prep HPF POC MANY (*)    All other components within normal limits  URINALYSIS, ROUTINE W REFLEX MICROSCOPIC  RPR  HIV ANTIBODY (ROUTINE TESTING W REFLEX)  POC URINE PREG, ED  GC/CHLAMYDIA PROBE AMP ([place]) NOT AT [facility]    EKG None  Radiology No results found.  Procedures Procedures (including critical care time)  Medications Ordered in ED Medications  cefTRIAXone (ROCEPHIN) injection 250 mg (250 mg Intramuscular Given 06/11/18 2103)  azithromycin (ZITHROMAX) tablet 1,000 mg (1,000 mg Oral Given 06/11/18 2104)  lidocaine (PF) (XYLOCAINE) 1 % injection (5 mLs  Given 06/11/18 2104)  ondansetron (ZOFRAN-ODT) disintegrating tablet 4 mg (4 mg Oral Given 06/11/18 2122)  metroNIDAZOLE (FLAGYL) tablet 2,000 mg (2,000 mg Oral Given 06/11/18 2123)     Initial Impression / Assessment and Plan / ED Course  I have reviewed the triage vital signs and the nursing notes.  Pertinent labs & imaging results that were available during my care of the patient were reviewed by me and considered in my medical decision making (see chart for details).     Patient seen and examined. Work-up initiated. Medications ordered.   Vital signs reviewed and are as follows: BP (!) 143/94 (BP Location: Right Arm)   Pulse 85   Temp 99.2 F (37.3 C) (Oral)   Resp 16   Ht 5\' 8"  (1.727 m)   Wt 99.3 kg   SpO2 99%   BMI 33.30 kg/m   Wet prep is positive for  trichomonas which is consistent with the patient's exam.  Patient treated for gonorrhea, chlamydia, and trichomonas.  Will be discharged home with Bactroban for her rash which I suspect is most likely mild folliculitis.  Patient offered HIV and syphilis testing, she accepts. Patient counseled on safe sexual practices. Told them that they should not have sexual contact for next 7 days and that they need to inform sexual partners so that they can get tested and treated as well. Patient verbalizes understanding and agrees with plan.     Final Clinical Impressions(s) / ED Diagnoses   Final diagnoses:  Trichomoniasis  Rash and nonspecific skin eruption   Patient with vaginal discharge and rash as above.  No concern for PID, TOA.  Patient was concerned that rash was genital herpes however does not have classic appearance for this.  Topical antibiotics given.  Referral information to health department given.  ED Discharge Orders         Ordered    mupirocin cream (BACTROBAN) 2 %  2 times daily     06/11/18 2154           Renne Crigler, Cordelia Poche 06/11/18 2158    Azalia Bilis, MD 06/11/18 2321

## 2018-06-11 NOTE — ED Notes (Signed)
Patient advised, again, that need urine.

## 2018-06-12 LAB — RPR: RPR Ser Ql: NONREACTIVE

## 2018-06-12 LAB — HIV ANTIBODY (ROUTINE TESTING W REFLEX): HIV Screen 4th Generation wRfx: NONREACTIVE

## 2018-06-13 LAB — GC/CHLAMYDIA PROBE AMP (~~LOC~~) NOT AT ARMC
Chlamydia: NEGATIVE
NEISSERIA GONORRHEA: NEGATIVE

## 2018-07-14 ENCOUNTER — Emergency Department (HOSPITAL_COMMUNITY)
Admission: EM | Admit: 2018-07-14 | Discharge: 2018-07-14 | Disposition: A | Discharge status: Home / Self Care | Payer: Medicaid Other | Attending: Emergency Medicine | Admitting: Emergency Medicine

## 2018-07-14 ENCOUNTER — Encounter (HOSPITAL_COMMUNITY): Payer: Self-pay

## 2018-07-14 ENCOUNTER — Other Ambulatory Visit: Payer: Self-pay

## 2018-07-14 DIAGNOSIS — Z79899 Other long term (current) drug therapy: Secondary | ICD-10-CM | POA: Diagnosis not present

## 2018-07-14 DIAGNOSIS — J029 Acute pharyngitis, unspecified: Secondary | ICD-10-CM | POA: Diagnosis present

## 2018-07-14 DIAGNOSIS — J069 Acute upper respiratory infection, unspecified: Secondary | ICD-10-CM

## 2018-07-14 DIAGNOSIS — J019 Acute sinusitis, unspecified: Secondary | ICD-10-CM | POA: Insufficient documentation

## 2018-07-14 DIAGNOSIS — B9789 Other viral agents as the cause of diseases classified elsewhere: Secondary | ICD-10-CM

## 2018-07-14 DIAGNOSIS — J0110 Acute frontal sinusitis, unspecified: Secondary | ICD-10-CM

## 2018-07-14 DIAGNOSIS — F17210 Nicotine dependence, cigarettes, uncomplicated: Secondary | ICD-10-CM | POA: Diagnosis not present

## 2018-07-14 MED ORDER — AMOXICILLIN-POT CLAVULANATE 875-125 MG PO TABS: 1.0000 | ORAL_TABLET | Freq: Two times a day (BID) | ORAL | 0 refills | Status: DC

## 2018-07-14 MED ORDER — FLUTICASONE PROPIONATE 50 MCG/ACT NA SUSP: 1.0000 | Freq: Every day | NASAL | 2 refills | Status: DC

## 2018-07-14 MED ORDER — CETIRIZINE HCL 5 MG PO TABS: 5.0000 mg | ORAL_TABLET | Freq: Every day | ORAL | 0 refills | Status: DC

## 2018-07-14 MED ORDER — BENZONATATE 100 MG PO CAPS: 200.0000 mg | ORAL_CAPSULE | Freq: Three times a day (TID) | ORAL | 0 refills | Status: DC

## 2018-07-14 NOTE — ED Provider Notes (Signed)
[facility] EMERGENCY DEPARTMENT Provider Note   CSN: [id number] Arrival date & time: 07/14/18  1746     History   Chief Complaint Chief Complaint  Patient presents with  . URI    HPI Shannon Savage is a 22 y.o. female who presents to ED for URI symptoms for the past 8 days.  States that she has had sinus pain and pressure, sore throat, cough productive with clear mucus, chills.  She has not been taking any medications to help with her symptoms.  Denies any chest pain or shortness of breath, trouble breathing, trouble swallowing, drooling, trouble opening her mouth.  She was exposed to sick contacts with similar symptoms at home.  HPI  Past Medical History:  Diagnosis Date  . Ovarian cyst     There are no active problems to display for this patient.   Past Surgical History:  Procedure Laterality Date  . FRACTURE SURGERY       OB History   No obstetric history on file.      Home Medications    Prior to Admission medications   Medication Sig Start Date End Date Taking? Authorizing Provider  albuterol (PROVENTIL HFA;VENTOLIN HFA) 108 (90 Base) MCG/ACT inhaler Inhale 1-2 puffs into the lungs every 6 (six) hours as needed for wheezing or shortness of breath. 10/10/15   Trixie Dredge, PA-C  amoxicillin-clavulanate (AUGMENTIN) 875-125 MG tablet Take 1 tablet by mouth every 12 (twelve) hours. 07/14/18   Kalise Fickett, PA-C  benzonatate (TESSALON) 100 MG capsule Take 2 capsules (200 mg total) by mouth every 8 (eight) hours. 07/14/18   Winefred Hillesheim, PA-C  cetirizine (ZYRTEC) 5 MG tablet Take 1 tablet (5 mg total) by mouth daily. 07/14/18   Lakely Elmendorf, PA-C  fluticasone (FLONASE) 50 MCG/ACT nasal spray Place 1 spray into both nostrils daily. 07/14/18   Jeanie Mccard, PA-C  mupirocin cream (BACTROBAN) 2 % Apply 1 application topically 2 (two) times daily. 06/11/18   Renne Crigler, PA-C  naproxen (NAPROSYN) 500 MG tablet Take 1 po BID with food prn pain 03/02/16    Devoria Albe, MD  oxyCODONE-acetaminophen (PERCOCET) 5-325 MG tablet Take 1 tablet by mouth every 4 (four) hours as needed for moderate pain. Patient not taking: Reported on 01/24/2016 01/14/16   [name], MD    Family History History reviewed. No pertinent family history.  Social History Social History   Tobacco Use  . Smoking status: Current Some Day Smoker    Packs/day: 0.50    Types: Cigarettes  . Smokeless tobacco: Never Used  Substance Use Topics  . Alcohol use: No    Comment: occasionally  . Drug use: No     Allergies   Patient has no known allergies.   Review of Systems Review of Systems  Constitutional: Positive for chills. Negative for fever.  HENT: Positive for congestion, sinus pressure, sinus pain and sore throat.   Respiratory: Positive for cough. Negative for shortness of breath.      Physical Exam Updated Vital Signs BP (!) 135/102 (BP Location: Right Arm)   Pulse (!) 58   Temp 98.3 F (36.8 C) (Oral)   Resp 16   LMP 11/12/2017 (Approximate) Comment: has an ovarian cyst  SpO2 100%   Physical Exam Vitals signs and nursing note reviewed.  Constitutional:      General: She is not in acute distress.    Appearance: She is well-developed. She is not diaphoretic.  HENT:     Head: Normocephalic and atraumatic.  Right Ear: Tympanic membrane normal.     Left Ear: Tympanic membrane normal.     Nose:     Right Sinus: Maxillary sinus tenderness and frontal sinus tenderness present.     Left Sinus: Maxillary sinus tenderness and frontal sinus tenderness present.     Mouth/Throat:     Tonsils: No tonsillar exudate or tonsillar abscesses. Swelling: 1+ on the right. 1+ on the left.     Comments: Bilaterally, symmetrically enlarged tonsils without exudates. Patient does not appear to be in acute distress. No trismus or drooling present. No pooling of secretions. Patient is tolerating secretions and is not in respiratory distress. No neck pain or tenderness  to palpation of the neck. Full active and passive range of motion of the neck. No evidence of RPA or PTA. Eyes:     General: No scleral icterus.    Conjunctiva/sclera: Conjunctivae normal.  Neck:     Musculoskeletal: Normal range of motion.  Cardiovascular:     Rate and Rhythm: Normal rate and regular rhythm.  Pulmonary:     Effort: Pulmonary effort is normal. No respiratory distress.     Breath sounds: Normal breath sounds.  Skin:    Findings: No rash.  Neurological:     Mental Status: She is alert.      ED Treatments / Results  Labs (all labs ordered are listed, but only abnormal results are displayed) Labs Reviewed - No data to display  EKG None  Radiology No results found.  Procedures Procedures (including critical care time)  Medications Ordered in ED Medications - No data to display   Initial Impression / Assessment and Plan / ED Course  I have reviewed the triage vital signs and the nursing notes.  Pertinent labs & imaging results that were available during my care of the patient were reviewed by me and considered in my medical decision making (see chart for details).     22 year old female presents to ED for over 1 week history of sore throat, cough productive with mucus, chills, sinus pain and pressure.  On exam tonsils are bilaterally, symmetrically enlarged without exudates.  No trismus, drooling noted.  She is afebrile.  She has maxillary and frontal sinus tenderness to palpation.  Lungs are clear to auscultation bilaterally.  She is speaking complete sentences without difficulty.  No signs of RPA or PTA on exam.  Due to duration and characteristic of symptoms, will be treated with Augmentin for sinusitis.  Also give supportive treatment with antitussives, antihistamines and Flonase.  Advised to return to ED for any severe worsening symptoms.  Patient is hemodynamically stable, in NAD, and able to ambulate in the ED. Evaluation does not show pathology that  would require ongoing emergent intervention or inpatient treatment. I explained the diagnosis to the patient. Pain has been managed and has no complaints prior to discharge. Patient is comfortable with above plan and is stable for discharge at this time. All questions were answered prior to disposition. Strict return precautions for returning to the ED were discussed. Encouraged follow up with PCP.    Portions of this note were generated with Scientist, clinical (histocompatibility and immunogenetics). Dictation errors may occur despite best attempts at proofreading.   Final Clinical Impressions(s) / ED Diagnoses   Final diagnoses:  Acute non-recurrent frontal sinusitis  Viral URI with cough    ED Discharge Orders         Ordered    amoxicillin-clavulanate (AUGMENTIN) 875-125 MG tablet  Every 12 hours  07/14/18 2010    fluticasone (FLONASE) 50 MCG/ACT nasal spray  Daily     07/14/18 2010    cetirizine (ZYRTEC) 5 MG tablet  Daily     07/14/18 2010    benzonatate (TESSALON) 100 MG capsule  Every 8 hours     07/14/18 2010           Dietrich PatesKhatri, Lillyanne Bradburn, PA-C 07/14/18 2013    Virgina NorfolkCuratolo, Adam, DO 07/15/18 [phone]

## 2018-07-14 NOTE — ED Notes (Signed)
Pt stable, ambulatory, states understanding of discharge instructions 

## 2018-07-14 NOTE — Discharge Instructions (Signed)
Please complete the entire course of antibiotics regardless of symptom improvement to prevent worsening or recurrence of your infection. Take the Zyrtec, Tessalon Perles and Flonase as needed for your symptoms. Return to ED for worsening symptoms, trouble breathing or trouble swallowing, trouble opening your mouth or drooling, chest pain.

## 2018-07-14 NOTE — ED Triage Notes (Signed)
Pt endorses cold sx with congestion x 2 days. VSS. Afebrile.

## 2018-10-12 ENCOUNTER — Other Ambulatory Visit: Payer: Self-pay

## 2018-10-12 ENCOUNTER — Emergency Department (HOSPITAL_COMMUNITY)
Admission: EM | Admit: 2018-10-12 | Discharge: 2018-10-13 | Disposition: A | Discharge status: Home / Self Care | Payer: Medicaid Other | Attending: Emergency Medicine | Admitting: Emergency Medicine

## 2018-10-12 DIAGNOSIS — Z5321 Procedure and treatment not carried out due to patient leaving prior to being seen by health care provider: Secondary | ICD-10-CM | POA: Insufficient documentation

## 2018-10-12 NOTE — ED Triage Notes (Addendum)
Pt arrived with c/o nosebleeds x 3 days (not currently bleeding); pt states that it has been large clots and she is having HA, throat irritation, CP (when coughing) and chills. Pt states that she has not had nosebleed in a while.

## 2018-10-13 ENCOUNTER — Encounter (HOSPITAL_COMMUNITY): Payer: Self-pay

## 2018-10-13 ENCOUNTER — Other Ambulatory Visit: Payer: Self-pay

## 2018-10-13 NOTE — ED Notes (Signed)
No answer when called to room patient.

## 2019-11-17 ENCOUNTER — Encounter (HOSPITAL_COMMUNITY): Payer: Self-pay | Admitting: Emergency Medicine

## 2019-11-17 ENCOUNTER — Ambulatory Visit
Admission: EM | Admit: 2019-11-17 | Discharge: 2019-11-17 | Disposition: A | Discharge status: Home / Self Care | Payer: Self-pay

## 2019-11-17 ENCOUNTER — Emergency Department (HOSPITAL_COMMUNITY)
Admission: EM | Admit: 2019-11-17 | Discharge: 2019-11-17 | Disposition: A | Discharge status: Home / Self Care | Payer: Self-pay | Attending: Emergency Medicine | Admitting: Emergency Medicine

## 2019-11-17 ENCOUNTER — Other Ambulatory Visit: Payer: Self-pay

## 2019-11-17 ENCOUNTER — Emergency Department (HOSPITAL_COMMUNITY): Payer: Self-pay

## 2019-11-17 ENCOUNTER — Encounter (HOSPITAL_COMMUNITY): Payer: Self-pay

## 2019-11-17 DIAGNOSIS — F17210 Nicotine dependence, cigarettes, uncomplicated: Secondary | ICD-10-CM | POA: Insufficient documentation

## 2019-11-17 DIAGNOSIS — Z5321 Procedure and treatment not carried out due to patient leaving prior to being seen by health care provider: Secondary | ICD-10-CM | POA: Insufficient documentation

## 2019-11-17 DIAGNOSIS — R1030 Lower abdominal pain, unspecified: Secondary | ICD-10-CM | POA: Insufficient documentation

## 2019-11-17 DIAGNOSIS — R109 Unspecified abdominal pain: Secondary | ICD-10-CM | POA: Insufficient documentation

## 2019-11-17 DIAGNOSIS — Z793 Long term (current) use of hormonal contraceptives: Secondary | ICD-10-CM | POA: Insufficient documentation

## 2019-11-17 DIAGNOSIS — N938 Other specified abnormal uterine and vaginal bleeding: Secondary | ICD-10-CM | POA: Insufficient documentation

## 2019-11-17 LAB — COMPREHENSIVE METABOLIC PANEL
ALT: 11 U/L (ref 0–44)
AST: 32 U/L (ref 15–41)
Albumin: 4.2 g/dL (ref 3.5–5.0)
Alkaline Phosphatase: 91 U/L (ref 38–126)
Anion gap: 8 (ref 5–15)
BUN: 8 mg/dL (ref 6–20)
CO2: 22 mmol/L (ref 22–32)
Calcium: 8.6 mg/dL — ABNORMAL LOW (ref 8.9–10.3)
Chloride: 104 mmol/L (ref 98–111)
Creatinine, Ser: 0.78 mg/dL (ref 0.44–1.00)
GFR calc Af Amer: >60 mL/min (ref >60)
GFR calc non Af Amer: >60 mL/min (ref >60)
Glucose, Bld: 104 mg/dL — ABNORMAL HIGH (ref 70–99)
Potassium: 4.2 mmol/L (ref 3.5–5.1)
Sodium: 134 mmol/L — ABNORMAL LOW (ref 135–145)
Total Bilirubin: 0.8 mg/dL (ref 0.3–1.2)
Total Protein: 7.7 g/dL (ref 6.5–8.1)

## 2019-11-17 LAB — CBC
HCT: 43.3 % (ref 36.0–46.0)
Hemoglobin: 14.2 g/dL (ref 12.0–15.0)
MCH: 28.5 pg (ref 26.0–34.0)
MCHC: 32.8 g/dL (ref 30.0–36.0)
MCV: 86.8 fL (ref 80.0–100.0)
Platelets: 293 10*3/uL (ref 150–400)
RBC: 4.99 MIL/uL (ref 3.87–5.11)
RDW: 12.6 % (ref 11.5–15.5)
WBC: 3.7 10*3/uL — ABNORMAL LOW (ref 4.0–10.5)
nRBC: 0 % (ref 0.0–0.2)

## 2019-11-17 LAB — I-STAT BETA HCG BLOOD, ED (MC, WL, AP ONLY): I-stat hCG, quantitative: <5 m[IU]/mL (ref <5)

## 2019-11-17 LAB — LIPASE, BLOOD: Lipase: 20 U/L (ref 11–51)

## 2019-11-17 MED ORDER — SODIUM CHLORIDE 0.9% FLUSH: 3.0000 mL | Freq: Once | INTRAVENOUS | Status: DC

## 2019-11-17 NOTE — ED Triage Notes (Addendum)
Pt brought to ED by [facility] from home for c/o bilateral abd pain, no n/v/d, denies any injury, per pt she didn't have a period since last year and started having vaginal bleed today. 200 mcg IV Fentanyl given by EMS pta with no relief. BP 152/106, HR 90, R 24, SPO2 100% RA. 300 mL NS bolus given pta.

## 2019-11-17 NOTE — ED Triage Notes (Signed)
Pt seen at Urgent Care earlier and eloped from [facility] with an IV to come here. Pt c/o lower abdominal pain stating hx of ovarian cysts. Blood work obtained at [facility].

## 2019-11-18 ENCOUNTER — Emergency Department (HOSPITAL_COMMUNITY): Payer: Self-pay

## 2019-11-18 ENCOUNTER — Encounter (HOSPITAL_COMMUNITY): Payer: Self-pay

## 2019-11-18 ENCOUNTER — Emergency Department (HOSPITAL_COMMUNITY)
Admission: EM | Admit: 2019-11-18 | Discharge: 2019-11-18 | Disposition: A | Discharge status: Home / Self Care | Payer: Self-pay | Attending: Emergency Medicine | Admitting: Emergency Medicine

## 2019-11-18 DIAGNOSIS — R1030 Lower abdominal pain, unspecified: Secondary | ICD-10-CM

## 2019-11-18 DIAGNOSIS — N939 Abnormal uterine and vaginal bleeding, unspecified: Secondary | ICD-10-CM

## 2019-11-18 LAB — URINALYSIS, ROUTINE W REFLEX MICROSCOPIC
Bacteria, UA: NONE SEEN
Bilirubin Urine: NEGATIVE
Glucose, UA: NEGATIVE mg/dL
Ketones, ur: 80 mg/dL — AB
Leukocytes,Ua: NEGATIVE
Nitrite: NEGATIVE
Protein, ur: NEGATIVE mg/dL
RBC / HPF: >50 RBC/hpf — ABNORMAL HIGH (ref 0–5)
Specific Gravity, Urine: 1.026 (ref 1.005–1.030)
pH: 5 (ref 5.0–8.0)

## 2019-11-18 MED ORDER — IOHEXOL 300 MG/ML  SOLN: 100.0000 mL | Freq: Once | INTRAMUSCULAR | Status: DC | PRN

## 2019-11-18 MED ORDER — SODIUM CHLORIDE (PF) 0.9 % IJ SOLN
INTRAMUSCULAR | Status: AC
  Filled 2019-11-18: qty 50

## 2019-11-18 MED ORDER — SODIUM CHLORIDE 0.9 % IV BOLUS: 1000.0000 mL | Freq: Once | INTRAVENOUS | Status: DC

## 2019-11-18 MED ORDER — MORPHINE SULFATE (PF) 4 MG/ML IV SOLN
4.0000 mg | Freq: Once | INTRAVENOUS | Status: AC
  Administered 2019-11-18: 01:00:00 4 mg via INTRAVENOUS
  Filled 2019-11-18: qty 1

## 2019-11-18 NOTE — ED Provider Notes (Addendum)
[facility]-EMERGENCY DEPT Provider Note   CSN: [id number] Arrival date & time: 11/17/19  2221     History Chief Complaint  Patient presents with  . Abdominal Pain    Shannon Savage is a 24 y.o. female.  The history is provided by the patient and medical records. No language interpreter was used.  Abdominal Pain Pain location:  LLQ, RLQ and suprapubic Pain quality: aching and cramping   Pain radiates to:  Does not radiate Pain severity:  Severe Onset quality:  Gradual Duration:  2 days Timing:  Constant Progression:  Waxing and waning Chronicity:  Recurrent Relieved by:  Nothing Worsened by:  Palpation Associated symptoms: vaginal bleeding   Associated symptoms: no chest pain, no chills, no constipation, no cough, no diarrhea, no dysuria, no fatigue, no fever, no nausea, no shortness of breath, no vaginal discharge and no vomiting   Risk factors: obesity        Past Medical History:  Diagnosis Date  . Ovarian cyst     There are no problems to display for this patient.   Past Surgical History:  Procedure Laterality Date  . FRACTURE SURGERY       OB History   No obstetric history on file.     No family history on file.  Social History   Tobacco Use  . Smoking status: Current Some Day Smoker    Packs/day: 0.50    Types: Cigarettes  . Smokeless tobacco: Never Used  Substance Use Topics  . Alcohol use: No    Comment: occasionally  . Drug use: No    Home Medications Prior to Admission medications   Medication Sig Start Date End Date Taking? Authorizing Provider  albuterol (PROVENTIL HFA;VENTOLIN HFA) 108 (90 Base) MCG/ACT inhaler Inhale 1-2 puffs into the lungs every 6 (six) hours as needed for wheezing or shortness of breath. 10/10/15   Trixie Dredge, PA-C  amoxicillin-clavulanate (AUGMENTIN) 875-125 MG tablet Take 1 tablet by mouth every 12 (twelve) hours. 07/14/18   Khatri, Hina, PA-C  benzonatate (TESSALON) 100 MG capsule Take 2  capsules (200 mg total) by mouth every 8 (eight) hours. 07/14/18   Khatri, Hina, PA-C  cetirizine (ZYRTEC) 5 MG tablet Take 1 tablet (5 mg total) by mouth daily. 07/14/18   Khatri, Hina, PA-C  fluticasone (FLONASE) 50 MCG/ACT nasal spray Place 1 spray into both nostrils daily. 07/14/18   Khatri, Hina, PA-C  mupirocin cream (BACTROBAN) 2 % Apply 1 application topically 2 (two) times daily. 06/11/18   Renne Crigler, PA-C  naproxen (NAPROSYN) 500 MG tablet Take 1 po BID with food prn pain 03/02/16   Devoria Albe, MD  oxyCODONE-acetaminophen (PERCOCET) 5-325 MG tablet Take 1 tablet by mouth every 4 (four) hours as needed for moderate pain. Patient not taking: Reported on 01/24/2016 01/14/16   [name], MD    Allergies    Patient has no known allergies.  Review of Systems   Review of Systems  Constitutional: Negative for chills, fatigue and fever.  HENT: Negative for congestion.   Respiratory: Negative for cough, chest tightness and shortness of breath.   Cardiovascular: Negative for chest pain.  Gastrointestinal: Positive for abdominal pain. Negative for constipation, diarrhea, nausea and vomiting.  Genitourinary: Positive for pelvic pain, vaginal bleeding and vaginal pain. Negative for dysuria, flank pain, frequency and vaginal discharge.  Musculoskeletal: Positive for back pain. Negative for neck pain.  Neurological: Negative for light-headedness and headaches.  Psychiatric/Behavioral: Positive for agitation.  All other systems reviewed and  are negative.   Physical Exam Updated Vital Signs BP (!) 145/102 (BP Location: Left Arm)   Pulse 92   Temp 98.1 F (36.7 C) (Oral)   Resp 20   Ht 5\' 7"  (1.702 m)   Wt 99 kg   SpO2 100%   BMI 34.18 kg/m   Physical Exam Vitals and nursing note reviewed.  Constitutional:      General: She is not in acute distress.    Appearance: She is well-developed. She is not ill-appearing, toxic-appearing or diaphoretic.  HENT:     Head: Normocephalic  and atraumatic.     Right Ear: External ear normal.     Left Ear: External ear normal.     Nose: Nose normal.     Mouth/Throat:     Pharynx: No oropharyngeal exudate.  Eyes:     Conjunctiva/sclera: Conjunctivae normal.     Pupils: Pupils are equal, round, and reactive to light.  Cardiovascular:     Rate and Rhythm: Normal rate.     Heart sounds: Normal heart sounds. No murmur.  Pulmonary:     Effort: Pulmonary effort is normal. No respiratory distress.     Breath sounds: No stridor. No wheezing or rales.  Abdominal:     General: Abdomen is flat. Bowel sounds are normal. There is no distension.     Tenderness: There is abdominal tenderness in the right lower quadrant, suprapubic area and left lower quadrant. There is no right CVA tenderness, left CVA tenderness or rebound.  Genitourinary:    Comments: Refused pelvic exam Musculoskeletal:     Cervical back: Normal range of motion and neck supple.  Skin:    General: Skin is warm.     Findings: No erythema or rash.  Neurological:     Mental Status: She is alert and oriented to person, place, and time.     Motor: No abnormal muscle tone.     Coordination: Coordination normal.     Deep Tendon Reflexes: Reflexes are normal and symmetric.     ED Results / Procedures / Treatments   Labs (all labs ordered are listed, but only abnormal results are displayed) Labs Reviewed  URINALYSIS, ROUTINE W REFLEX MICROSCOPIC    EKG None  Radiology No results found.  Procedures Procedures (including critical care time)  Medications Ordered in ED Medications  sodium chloride 0.9 % bolus 1,000 mL (has no administration in time range)  morphine 4 MG/ML injection 4 mg (4 mg Intravenous Given 11/18/19 0057)    ED Course  I have reviewed the triage vital signs and the nursing notes.  Pertinent labs & imaging results that were available during my care of the patient were reviewed by me and considered in my medical decision making (see chart  for details).    MDM Rules/Calculators/A&P                      Shannon Savage is a 24 y.o. female with a past medical history significant for ovarian cyst who presents with severe abdominal pain and vaginal bleeding.  Patient reports that she has been on Depo shot and has had irregular menstrual cycles over the last year.  She says that her cycle began yesterday and she had severe lower abdominal pain.  She reports it feels similar but worse than when she has had ovarian problems in the past.  She says that previously she was admitted thinking she had appendicitis but was actually her ovaries.  She reports no  significant nausea, vomiting, constipation, or diarrhea.  No urinary symptoms.  No recent trauma.  She denies fevers, chills, ingestion, or cough.  No other complaints other than the severe pain.  According to patient, she went to urgent care by EMS then went to [facility] but then came to this facility for evaluation as it was taking too long to be seen.  Patient had blood work drawn at [facility] this evening.  Patient reports her pain is "15 out of 10".  Patient reports she has taken Tylenol without significant relief.  On exam, abdomen is tender in the lower abdomen.  Lungs clear and chest is nontender.  Back is tender diffusely.  Patient is crying and writhing around the bed during the exam.  Patient refused pelvic exam.  Given the patient's history of ovarian cyst, we discussed the possibility of recurrent ovarian pain in the setting of her menstrual cycle.  We also discussed endometriosis or torsion given her cyst.  Given the primarily lower focus with her vaginal bleeding, I suspect that an ultrasound would be more beneficial than a CT scan at this time.  Patient agrees.  She refused the pelvic exam but will be amenable to the ultrasound.  Patient will be given pain medicine.  Patient's labs from earlier showed a normal kidney function and liver function.  CBC showed no leukocytosis or  anemia.  Urinalysis had not yet been collected and she is not pregnant.  We will try and recollect urinalysis from her and will get the ultrasound.  Anticipate reassessment after ultrasound and urine are completed.  Care transferred to oncoming team while waiting for results.  Anticipate discharge if work-up is reassuring at this is pain from her ovarian cysts.  If the ultrasound is completely normal and patient is still having worsening pain, would consider CT scan.   1:27 AM Patient just refused the pelvic ultrasound.  She would rather have a CT scan and get some fluids.  These will be ordered.  Care transferred to oncoming team while awaiting results of CT scan to determine disposition.  1:46 AM Patient decides she wants to be discharged home and not get a CT scan or other work-up.  Patient will follow up with her OB/GYN team and PCP.  She understood return precautions and was discharged in good condition without further work-up.   Final Clinical Impression(s) / ED Diagnoses Final diagnoses:  Lower abdominal pain  Vaginal bleeding     Clinical Impression: 1. Lower abdominal pain   2. Vaginal bleeding     Disposition:  Care transferred to oncoming team while waiting for results.  Anticipate discharge if work-up is reassuring at this is pain from her ovarian cysts.   This note was prepared with assistance of Conservation officer, historic buildings. Occasional wrong-word or sound-a-like substitutions may have occurred due to the inherent limitations of voice recognition software.     Laasia Arcos, Canary Brim, MD 11/18/19 0131    Kimie Pidcock, Canary Brim, MD 11/18/19 [phone]

## 2019-11-18 NOTE — ED Notes (Signed)
Pt states she is tired and just wants to go home. Pt states " I dont want to do this anymore, I dont want the test." This nurse explained to the patient that by letting us complete testing it would give the provider a clearer picture of what is going on allowing [place] to better treat her. Pt still refuses and states she will come back in pain incereases , but she does not want to be here. Provider made aware of pts wishes.

## 2019-11-18 NOTE — ED Notes (Signed)
Ultrasound tech at pt bedside  

## 2019-11-18 NOTE — Discharge Instructions (Signed)
We were unable to fully determine the cause of your abdominal pain tonight as you did not allow adequate ultrasound or CT imaging.  We suspect based on your history that your pain is related to your ovarian cyst history and menstrual cycle however we cannot rule out other abnormalities.  Your laboratory testing earlier tonight was reassuring.  Please rest and stay hydrated and follow-up with your OB/GYN team and a PCP.  If any symptoms change or worsen, please return to nearest emergency department.

## 2019-11-18 NOTE — ED Notes (Signed)
Pt refused U/S provider made aware

## 2020-07-20 IMAGING — US US PELVIS COMPLETE
1 series · 14 of 25 positions shown · non-contrast
Comparison: 01/14/2016

CLINICAL DATA: Pelvic and abdominal pain

EXAM:
TRANSABDOMINAL ULTRASOUND OF PELVIS
TECHNIQUE: Transabdominal ultrasound examination of the pelvis was performed
including evaluation of the uterus, ovaries, adnexal regions, and
pelvic cul-de-sac.

[Series 1: us pelvis complete · 14 of 33 slices shown]
[im 1/33]
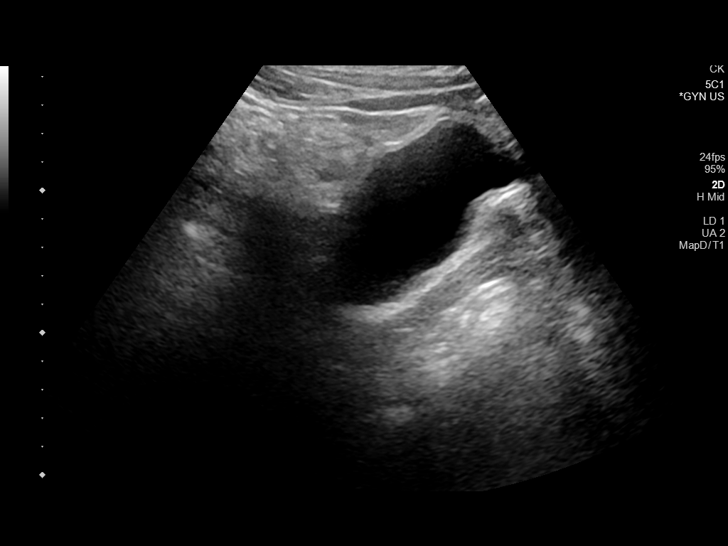
[im 3/33]
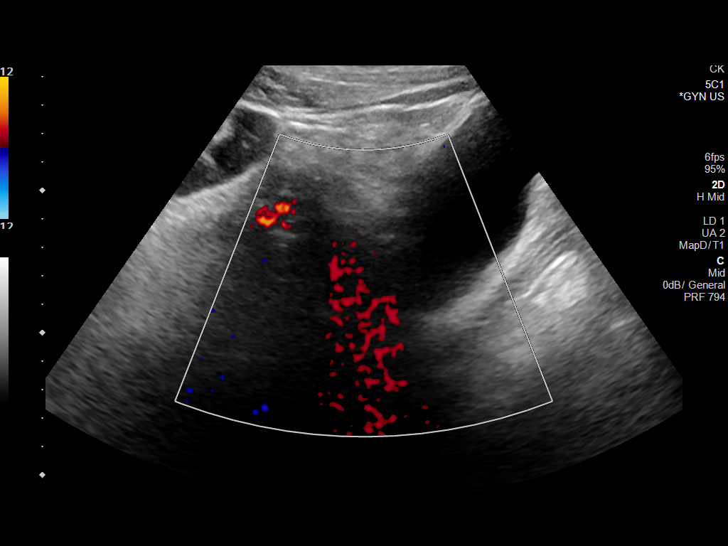
[im 6/33]
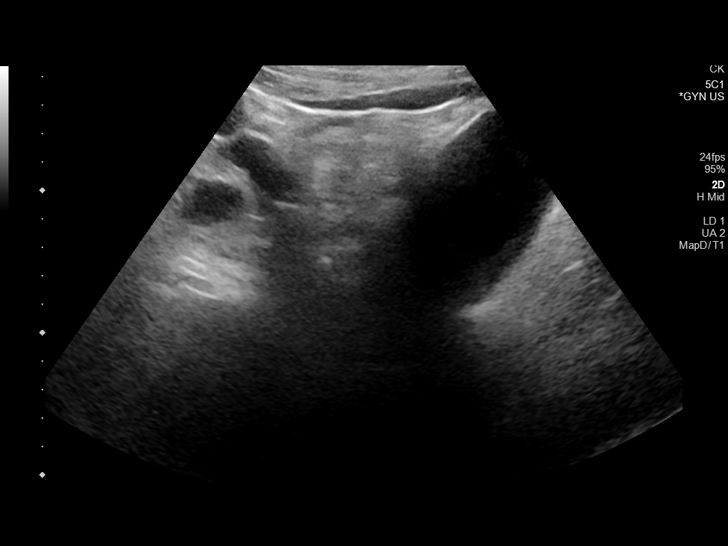
[im 9/33]
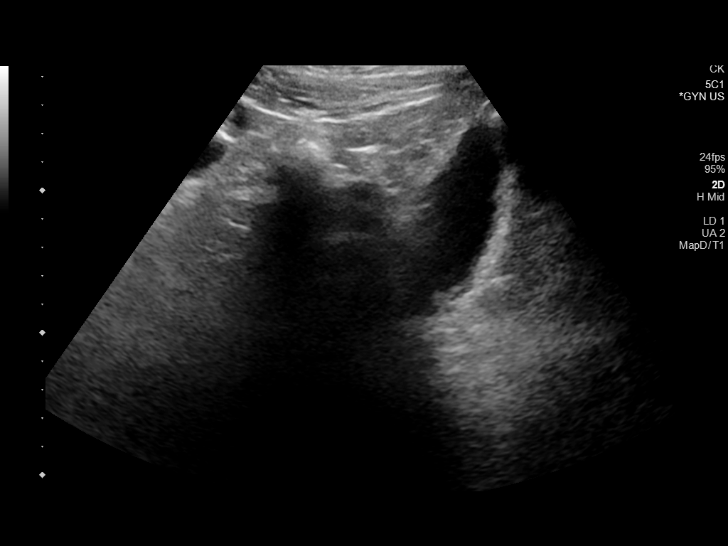
[im 11/33]
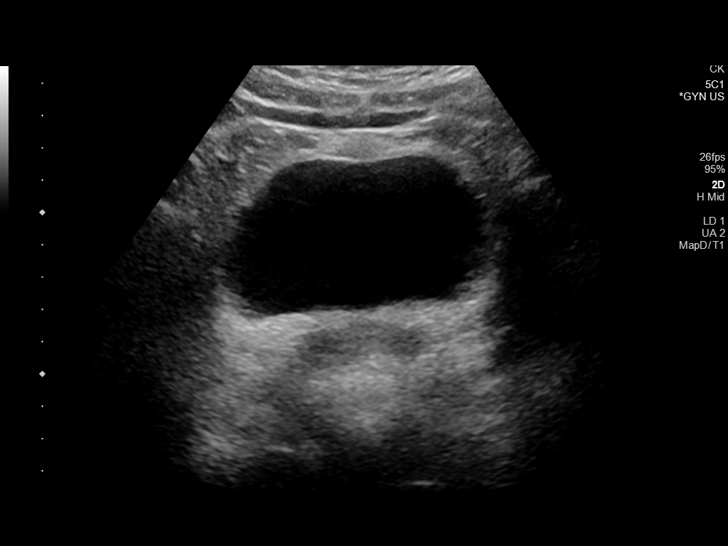
[im 13/33]
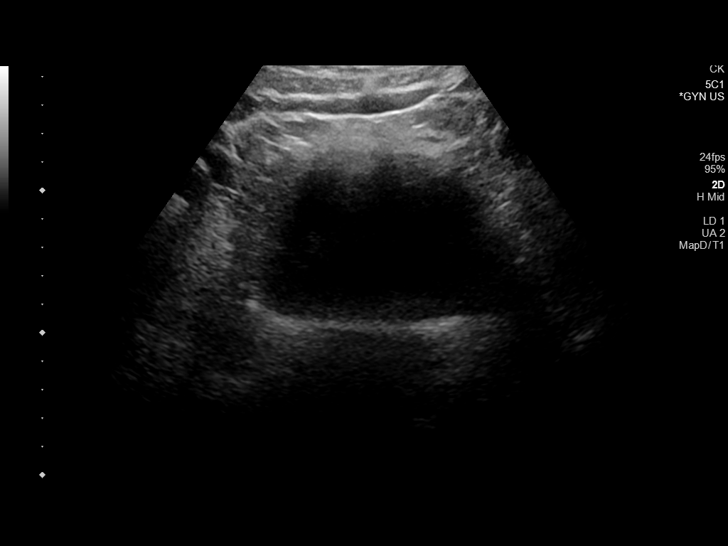
[im 15/33]
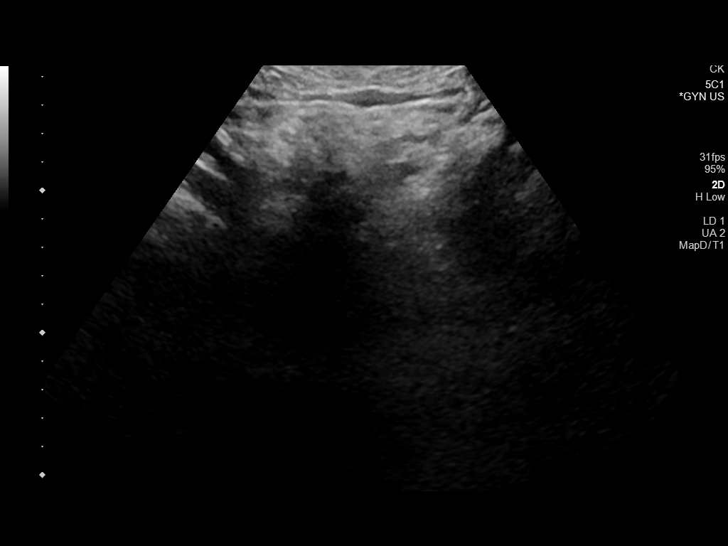
[im 18/33]
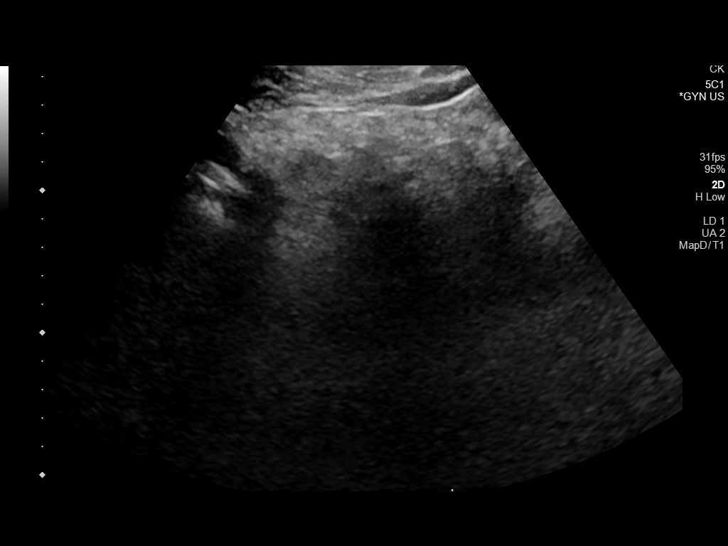
[im 21/33]
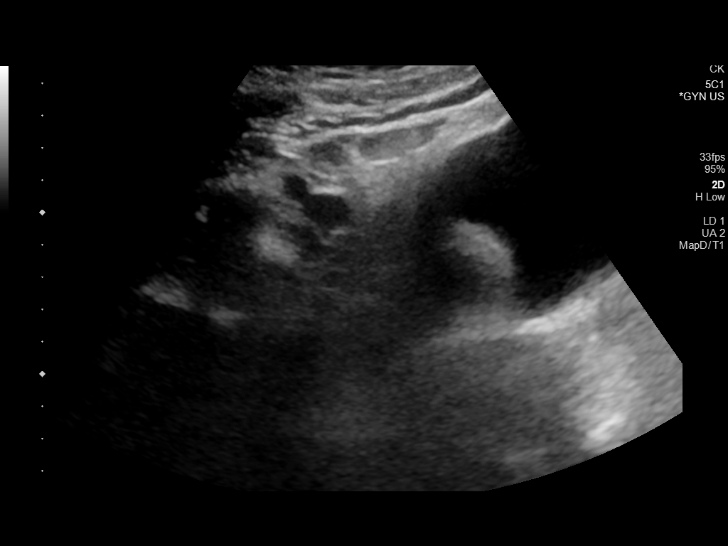
[im 22/33]
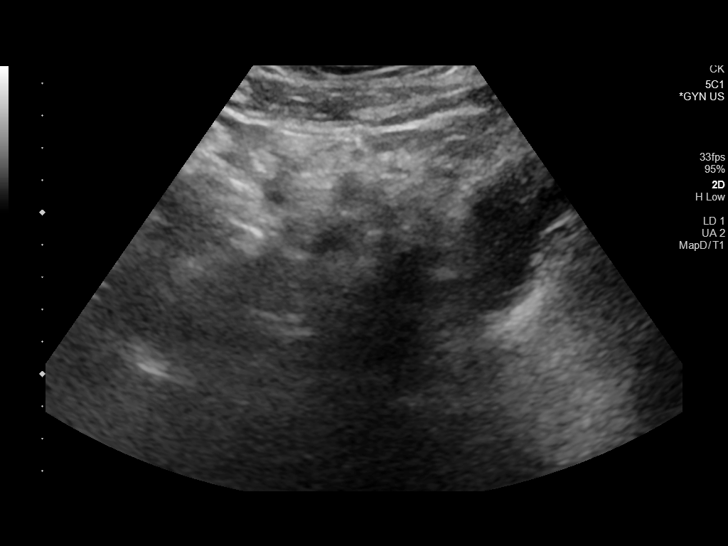
[im 25/33]
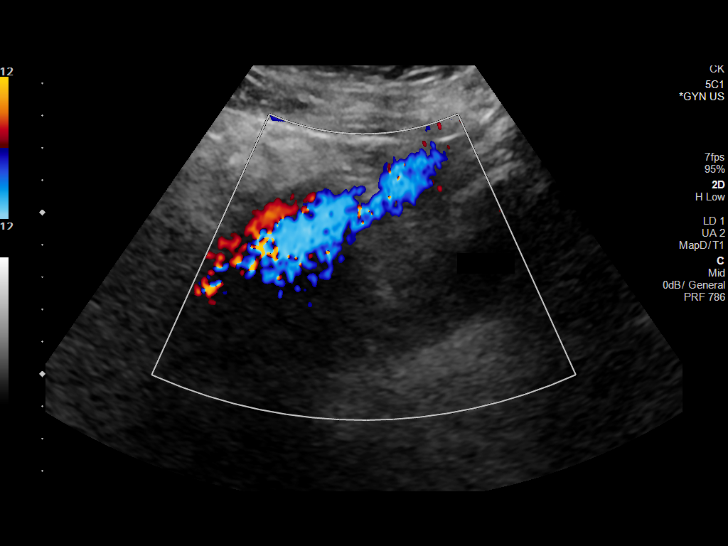
[im 27/33]
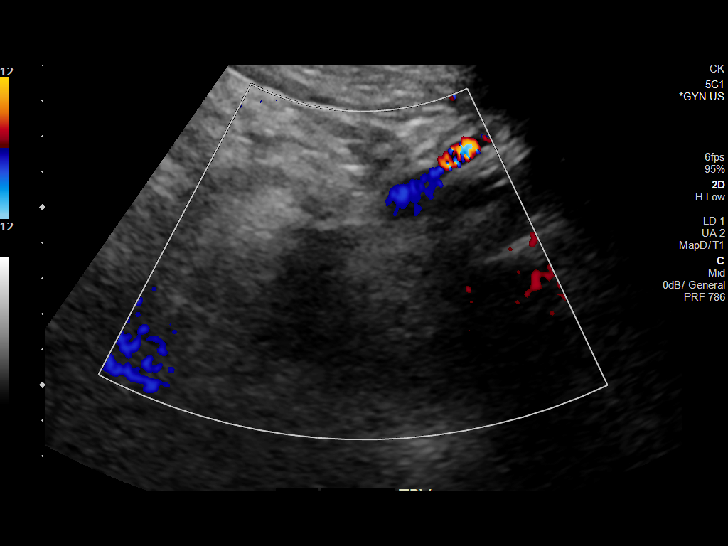
[im 30/33]
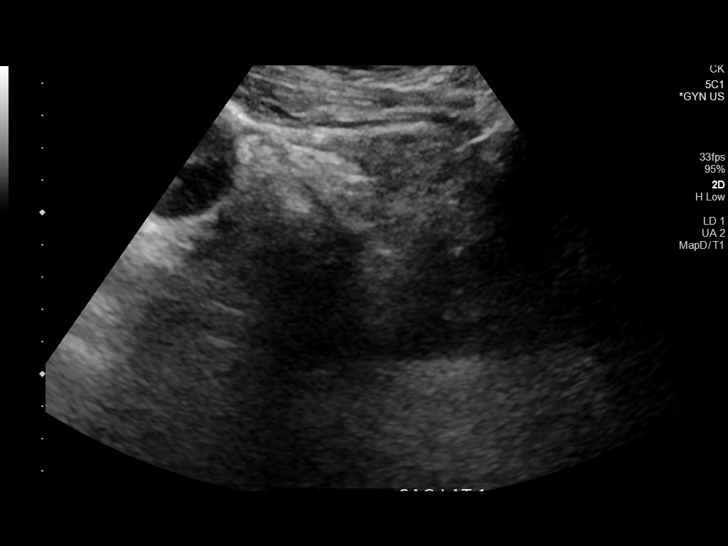
[im 33/33]
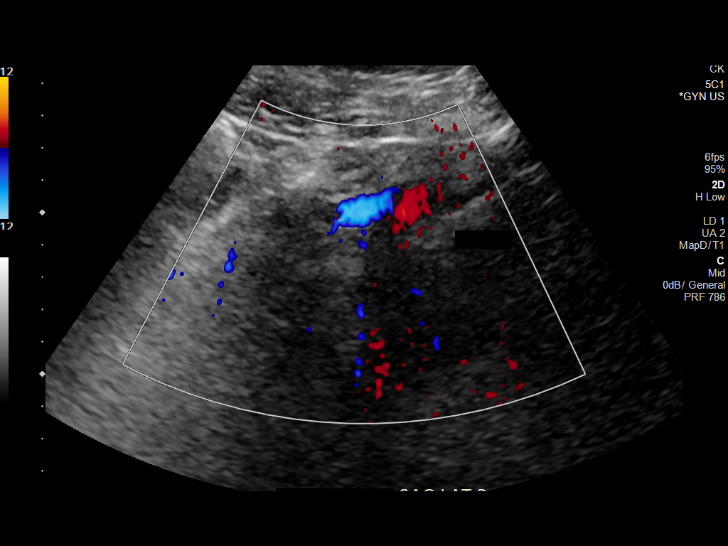

[14 of 25 positions shown; findings below may reference images not displayed]

FINDINGS: Uterus

Not well visualized due to under distended bladder.

Endometrium

Not visualized.

Right ovary

Not visualized.

Left ovary

Not visualized.

Other findings:  No free fluid is seen within the pelvis.

The patient refused endovaginal evaluation. The transabdominal study
is essentially nondiagnostic due to under distended bladder.
IMPRESSION: 1. Essentially nondiagnostic transabdominal study due to under
distended urinary bladder. Patient refused endovaginal evaluation.

## 2023-05-18 ENCOUNTER — Encounter (HOSPITAL_COMMUNITY): Payer: Self-pay

## 2023-05-18 ENCOUNTER — Ambulatory Visit (HOSPITAL_COMMUNITY)
Admission: EM | Admit: 2023-05-18 | Discharge: 2023-05-18 | Disposition: A | Discharge status: Home / Self Care | Payer: Medicaid Other | Attending: Family Medicine | Admitting: Family Medicine

## 2023-05-18 ENCOUNTER — Ambulatory Visit (INDEPENDENT_AMBULATORY_CARE_PROVIDER_SITE_OTHER): Payer: Medicaid Other

## 2023-05-18 DIAGNOSIS — M25522 Pain in left elbow: Secondary | ICD-10-CM | POA: Diagnosis not present

## 2023-05-18 MED ORDER — HYDROCODONE-ACETAMINOPHEN 5-325 MG PO TABS: 1.0000 | ORAL_TABLET | Freq: Four times a day (QID) | ORAL | 0 refills | Status: AC | PRN

## 2023-05-18 NOTE — ED Notes (Signed)
Called ortho tech

## 2023-05-18 NOTE — Discharge Instructions (Signed)
Be aware, you have been prescribed pain medications that may cause drowsiness. While taking this medication, do not take any other medications containing acetaminophen (Tylenol). Do not combine with alcohol or recreational drugs. Please do not drive, operate heavy machinery, or take part in activities that require making important decisions while on this medication as your judgement may be clouded.
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
## 2023-05-18 NOTE — Progress Notes (Signed)
Orthopedic Tech Progress Note Patient Details:  Shannon Savage 04-Jun-1996 [id number]  Ortho Devices Type of Ortho Device: Long arm splint, Sling immobilizer Ortho Device/Splint Location: LUE Ortho Device/Splint Interventions: Ordered, Adjustment, Application   Post Interventions Patient Tolerated: Well Instructions Provided: Care of device Splint applied as is due t patient not being able to fully extend their arm. Darleen Crocker 05/18/2023, 8:07 PM
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
